# Patient Record
Sex: Female | Born: 1970
Health system: Southern US, Community
[De-identification: ages and names within clinical notes are randomized; demographics above are authoritative.]

## PROBLEM LIST (undated history)

## (undated) DIAGNOSIS — I10 Essential (primary) hypertension: Secondary | ICD-10-CM

## (undated) DIAGNOSIS — E785 Hyperlipidemia, unspecified: Secondary | ICD-10-CM

## (undated) DIAGNOSIS — Z87891 Personal history of nicotine dependence: Secondary | ICD-10-CM

## (undated) DIAGNOSIS — I2510 Atherosclerotic heart disease of native coronary artery without angina pectoris: Secondary | ICD-10-CM

## (undated) DIAGNOSIS — Z862 Personal history of diseases of the blood and blood-forming organs and certain disorders involving the immune mechanism: Secondary | ICD-10-CM

## (undated) DIAGNOSIS — I2119 ST elevation (STEMI) myocardial infarction involving other coronary artery of inferior wall: Secondary | ICD-10-CM

## (undated) DIAGNOSIS — E669 Obesity, unspecified: Secondary | ICD-10-CM

## (undated) HISTORY — DX: ST elevation (STEMI) myocardial infarction involving other coronary artery of inferior wall: I21.19

## (undated) HISTORY — DX: Personal history of nicotine dependence: Z87.891

## (undated) HISTORY — DX: Atherosclerotic heart disease of native coronary artery without angina pectoris: I25.10

## (undated) HISTORY — DX: Essential (primary) hypertension: I10

## (undated) HISTORY — DX: Personal history of diseases of the blood and blood-forming organs and certain disorders involving the immune mechanism: Z86.2

## (undated) HISTORY — DX: Hyperlipidemia, unspecified: E78.5

## (undated) HISTORY — DX: Obesity, unspecified: E66.9

---

## 2009-11-22 ENCOUNTER — Emergency Department (HOSPITAL_COMMUNITY): Admission: EM | Admit: 2009-11-22 | Discharge: 2009-11-23 | Payer: Self-pay | Admitting: Emergency Medicine

## 2010-02-02 DIAGNOSIS — I2119 ST elevation (STEMI) myocardial infarction involving other coronary artery of inferior wall: Secondary | ICD-10-CM

## 2010-02-02 HISTORY — PX: CORONARY STENT PLACEMENT: SHX1402

## 2010-02-02 HISTORY — DX: ST elevation (STEMI) myocardial infarction involving other coronary artery of inferior wall: I21.19

## 2010-02-20 ENCOUNTER — Inpatient Hospital Stay (HOSPITAL_COMMUNITY): Admission: EM | Admit: 2010-02-20 | Discharge: 2010-02-23 | Payer: Self-pay | Admitting: Emergency Medicine

## 2010-03-08 ENCOUNTER — Encounter (HOSPITAL_COMMUNITY): Admission: RE | Admit: 2010-03-08 | Discharge: 2010-06-04 | Payer: Self-pay | Admitting: Cardiology

## 2010-03-13 ENCOUNTER — Ambulatory Visit: Payer: Self-pay | Admitting: Cardiology

## 2010-04-23 ENCOUNTER — Ambulatory Visit: Payer: Self-pay | Admitting: Cardiology

## 2010-05-31 ENCOUNTER — Ambulatory Visit: Payer: Self-pay | Admitting: Cardiology

## 2010-10-20 LAB — COMPREHENSIVE METABOLIC PANEL
ALT: 22 U/L (ref 0–35)
AST: 47 U/L — ABNORMAL HIGH (ref 0–37)
Albumin: 3.4 g/dL — ABNORMAL LOW (ref 3.5–5.2)
Alkaline Phosphatase: 52 U/L (ref 39–117)
Creatinine, Ser: 1.4 mg/dL — ABNORMAL HIGH (ref 0.4–1.2)
GFR calc non Af Amer: 42 mL/min — ABNORMAL LOW (ref >60)
Sodium: 134 mEq/L — ABNORMAL LOW (ref 135–145)
Total Bilirubin: 0.3 mg/dL (ref 0.3–1.2)

## 2010-10-20 LAB — POCT I-STAT, CHEM 8
BUN: 17 mg/dL (ref 6–23)
Calcium, Ion: 1.08 mmol/L — ABNORMAL LOW (ref 1.12–1.32)
Chloride: 109 mEq/L (ref 96–112)
Glucose, Bld: 99 mg/dL (ref 70–99)
HCT: 36 % (ref 36.0–46.0)
Potassium: 4 mEq/L (ref 3.5–5.1)
Sodium: 137 mEq/L (ref 135–145)

## 2010-10-20 LAB — BASIC METABOLIC PANEL
Calcium: 8.6 mg/dL (ref 8.4–10.5)
Chloride: 107 mEq/L (ref 96–112)
GFR calc Af Amer: >60 mL/min (ref >60)
GFR calc non Af Amer: 50 mL/min — ABNORMAL LOW (ref >60)
Glucose, Bld: 101 mg/dL — ABNORMAL HIGH (ref 70–99)
Potassium: 4 mEq/L (ref 3.5–5.1)
Sodium: 138 mEq/L (ref 135–145)

## 2010-10-20 LAB — CARDIAC PANEL(CRET KIN+CKTOT+MB+TROPI)
Relative Index: 12.6 — ABNORMAL HIGH (ref 0.0–2.5)
Total CK: 764 U/L — ABNORMAL HIGH (ref 7–177)
Troponin I: 11.1 ng/mL (ref 0.00–0.06)
Troponin I: 27.69 ng/mL (ref 0.00–0.06)

## 2010-10-20 LAB — FOLATE: Folate: 5.7 ng/mL

## 2010-10-20 LAB — PROTIME-INR
INR: 1.21 (ref 0.00–1.49)
Prothrombin Time: 15.2 seconds (ref 11.6–15.2)

## 2010-10-20 LAB — FERRITIN: Ferritin: 12 ng/mL (ref 10–291)

## 2010-10-20 LAB — CBC
HCT: 30 % — ABNORMAL LOW (ref 36.0–46.0)
HCT: 33.7 % — ABNORMAL LOW (ref 36.0–46.0)
Hemoglobin: 9.9 g/dL — ABNORMAL LOW (ref 12.0–15.0)
MCH: 27.8 pg (ref 26.0–34.0)
MCH: 27.9 pg (ref 26.0–34.0)
MCH: 28 pg (ref 26.0–34.0)
MCHC: 33.5 g/dL (ref 30.0–36.0)
MCV: 83.2 fL (ref 78.0–100.0)
Platelets: 315 10*3/uL (ref 150–400)
Platelets: 328 10*3/uL (ref 150–400)
RBC: 3.53 MIL/uL — ABNORMAL LOW (ref 3.87–5.11)
RBC: 4.04 MIL/uL (ref 3.87–5.11)
RDW: 16.4 % — ABNORMAL HIGH (ref 11.5–15.5)
RDW: 16.4 % — ABNORMAL HIGH (ref 11.5–15.5)
RDW: 16.8 % — ABNORMAL HIGH (ref 11.5–15.5)
WBC: 9.5 10*3/uL (ref 4.0–10.5)

## 2010-10-20 LAB — DIFFERENTIAL
Eosinophils Relative: 1 % (ref 0–5)
Lymphocytes Relative: 17 % (ref 12–46)
Lymphs Abs: 1.9 10*3/uL (ref 0.7–4.0)
Monocytes Relative: 5 % (ref 3–12)
Neutrophils Relative %: 76 % (ref 43–77)

## 2010-10-20 LAB — IRON AND TIBC
Iron: 33 ug/dL — ABNORMAL LOW (ref 42–135)
Saturation Ratios: 9 % — ABNORMAL LOW (ref 20–55)

## 2010-10-20 LAB — VITAMIN B12: Vitamin B-12: 407 pg/mL (ref 211–911)

## 2010-10-20 LAB — RETICULOCYTES: Retic Ct Pct: 1.5 % (ref 0.4–3.1)

## 2010-10-23 LAB — CBC
HCT: 29 % — ABNORMAL LOW (ref 36.0–46.0)
MCV: 83.3 fL (ref 78.0–100.0)
Platelets: 349 10*3/uL (ref 150–400)
RBC: 3.49 MIL/uL — ABNORMAL LOW (ref 3.87–5.11)
WBC: 12.8 10*3/uL — ABNORMAL HIGH (ref 4.0–10.5)

## 2010-10-23 LAB — POCT I-STAT, CHEM 8
Calcium, Ion: 1.05 mmol/L — ABNORMAL LOW (ref 1.12–1.32)
Hemoglobin: 10.9 g/dL — ABNORMAL LOW (ref 12.0–15.0)
Potassium: 3.7 mEq/L (ref 3.5–5.1)

## 2010-10-23 LAB — DIFFERENTIAL
Eosinophils Relative: 1 % (ref 0–5)
Lymphocytes Relative: 17 % (ref 12–46)
Lymphs Abs: 2.2 10*3/uL (ref 0.7–4.0)
Monocytes Relative: 6 % (ref 3–12)

## 2010-10-23 LAB — D-DIMER, QUANTITATIVE: D-Dimer, Quant: 0.37 ug/mL-FEU (ref 0.00–0.48)

## 2010-10-23 LAB — POCT CARDIAC MARKERS: Troponin i, poc: <0.05 ng/mL (ref 0.00–0.09)

## 2010-10-24 ENCOUNTER — Encounter: Payer: Self-pay | Admitting: Cardiology

## 2010-10-24 DIAGNOSIS — I10 Essential (primary) hypertension: Secondary | ICD-10-CM | POA: Insufficient documentation

## 2010-10-24 DIAGNOSIS — E785 Hyperlipidemia, unspecified: Secondary | ICD-10-CM | POA: Insufficient documentation

## 2010-10-24 DIAGNOSIS — E669 Obesity, unspecified: Secondary | ICD-10-CM | POA: Insufficient documentation

## 2010-10-24 DIAGNOSIS — I2119 ST elevation (STEMI) myocardial infarction involving other coronary artery of inferior wall: Secondary | ICD-10-CM | POA: Insufficient documentation

## 2010-10-24 DIAGNOSIS — Z862 Personal history of diseases of the blood and blood-forming organs and certain disorders involving the immune mechanism: Secondary | ICD-10-CM | POA: Insufficient documentation

## 2010-10-24 DIAGNOSIS — I2510 Atherosclerotic heart disease of native coronary artery without angina pectoris: Secondary | ICD-10-CM | POA: Insufficient documentation

## 2010-10-30 ENCOUNTER — Ambulatory Visit: Payer: Self-pay | Admitting: Nurse Practitioner

## 2010-11-13 ENCOUNTER — Telehealth: Payer: Self-pay | Admitting: Cardiology

## 2010-11-13 ENCOUNTER — Emergency Department (HOSPITAL_COMMUNITY): Payer: BLUE CROSS/BLUE SHIELD

## 2010-11-13 ENCOUNTER — Emergency Department (HOSPITAL_COMMUNITY)
Admission: EM | Admit: 2010-11-13 | Discharge: 2010-11-13 | Disposition: A | Discharge status: Home / Self Care | Payer: BLUE CROSS/BLUE SHIELD | Attending: Emergency Medicine | Admitting: Emergency Medicine

## 2010-11-13 DIAGNOSIS — I252 Old myocardial infarction: Secondary | ICD-10-CM | POA: Insufficient documentation

## 2010-11-13 DIAGNOSIS — Z9889 Other specified postprocedural states: Secondary | ICD-10-CM | POA: Insufficient documentation

## 2010-11-13 DIAGNOSIS — I10 Essential (primary) hypertension: Secondary | ICD-10-CM | POA: Insufficient documentation

## 2010-11-13 DIAGNOSIS — Z79899 Other long term (current) drug therapy: Secondary | ICD-10-CM | POA: Insufficient documentation

## 2010-11-13 DIAGNOSIS — R072 Precordial pain: Secondary | ICD-10-CM | POA: Insufficient documentation

## 2010-11-13 LAB — DIFFERENTIAL
Eosinophils Relative: 3 % (ref 0–5)
Lymphocytes Relative: 17 % (ref 12–46)
Monocytes Absolute: 0.5 10*3/uL (ref 0.1–1.0)
Monocytes Relative: 5 % (ref 3–12)
Neutro Abs: 7.4 10*3/uL (ref 1.7–7.7)

## 2010-11-13 LAB — CBC
HCT: 29.9 % — ABNORMAL LOW (ref 36.0–46.0)
Hemoglobin: 9.2 g/dL — ABNORMAL LOW (ref 12.0–15.0)
MCH: 22.8 pg — ABNORMAL LOW (ref 26.0–34.0)
MCHC: 30.8 g/dL (ref 30.0–36.0)
RDW: 14.6 % (ref 11.5–15.5)

## 2010-11-13 LAB — POCT I-STAT, CHEM 8
BUN: 9 mg/dL (ref 6–23)
Creatinine, Ser: 1.2 mg/dL (ref 0.4–1.2)
Glucose, Bld: 98 mg/dL (ref 70–99)
Potassium: 4 mEq/L (ref 3.5–5.1)
Sodium: 140 mEq/L (ref 135–145)
TCO2: 25 mmol/L (ref 0–100)

## 2010-11-13 NOTE — Telephone Encounter (Signed)
IS HAVING ISSUES HEAVINESS IN CHEST WANTS TO SEE IF SHE CAN BE SEEN ASAP

## 2010-11-13 NOTE — Telephone Encounter (Signed)
Spoke with pt's husband, pt has been c/o chest heaviness for the last several days.  The chest pressure is getting worse.  Norma Fredrickson NP notified and instructed RN to have pt go to ER ASAP.  Pt's husband notified of Loris's instructions and verbalized to RN understanding of instructions.

## 2011-05-13 ENCOUNTER — Other Ambulatory Visit: Payer: Self-pay | Admitting: Cardiology

## 2011-05-23 ENCOUNTER — Encounter: Payer: Self-pay | Admitting: Nurse Practitioner

## 2011-05-23 ENCOUNTER — Ambulatory Visit (INDEPENDENT_AMBULATORY_CARE_PROVIDER_SITE_OTHER): Payer: BC Managed Care – PPO | Admitting: Nurse Practitioner

## 2011-05-23 VITALS — BP 128/90 | HR 80 | Ht 62.0 in | Wt 223.4 lb

## 2011-05-23 DIAGNOSIS — E785 Hyperlipidemia, unspecified: Secondary | ICD-10-CM

## 2011-05-23 DIAGNOSIS — D649 Anemia, unspecified: Secondary | ICD-10-CM | POA: Insufficient documentation

## 2011-05-23 DIAGNOSIS — I2510 Atherosclerotic heart disease of native coronary artery without angina pectoris: Secondary | ICD-10-CM

## 2011-05-23 LAB — CBC WITH DIFFERENTIAL/PLATELET
Basophils Absolute: 0 10*3/uL (ref 0.0–0.1)
Basophils Relative: 0.4 % (ref 0.0–3.0)
Eosinophils Absolute: 0.2 10*3/uL (ref 0.0–0.7)
Eosinophils Relative: 2.5 % (ref 0.0–5.0)
HCT: 28.2 % — ABNORMAL LOW (ref 36.0–46.0)
Hemoglobin: 8.8 g/dL — ABNORMAL LOW (ref 12.0–15.0)
Lymphocytes Relative: 32.3 % (ref 12.0–46.0)
Lymphs Abs: 2.2 10*3/uL (ref 0.7–4.0)
MCHC: 31.1 g/dL (ref 30.0–36.0)
MCV: 69.2 fl — ABNORMAL LOW (ref 78.0–100.0)
Monocytes Absolute: 0.3 10*3/uL (ref 0.1–1.0)
Monocytes Relative: 4.8 % (ref 3.0–12.0)
Neutro Abs: 4.1 10*3/uL (ref 1.4–7.7)
Neutrophils Relative %: 60 % (ref 43.0–77.0)
Platelets: 388 10*3/uL (ref 150.0–400.0)
RBC: 4.07 Mil/uL (ref 3.87–5.11)
RDW: 16.7 % — ABNORMAL HIGH (ref 11.5–14.6)
WBC: 6.8 10*3/uL (ref 4.5–10.5)

## 2011-05-23 LAB — BASIC METABOLIC PANEL
BUN: 12 mg/dL (ref 6–23)
CO2: 26 mEq/L (ref 19–32)
Calcium: 8.7 mg/dL (ref 8.4–10.5)
Chloride: 107 mEq/L (ref 96–112)
Creatinine, Ser: 1 mg/dL (ref 0.4–1.2)
GFR: 64.57 mL/min (ref >60.00)
Glucose, Bld: 106 mg/dL — ABNORMAL HIGH (ref 70–99)
Potassium: 4 mEq/L (ref 3.5–5.1)
Sodium: 139 mEq/L (ref 135–145)

## 2011-05-23 LAB — LIPID PANEL
Cholesterol: 117 mg/dL (ref 0–200)
HDL: 35.7 mg/dL — ABNORMAL LOW (ref >39.00)
LDL Cholesterol: 48 mg/dL (ref 0–99)
Total CHOL/HDL Ratio: 3
Triglycerides: 165 mg/dL — ABNORMAL HIGH (ref 0.0–149.0)
VLDL: 33 mg/dL (ref 0.0–40.0)

## 2011-05-23 LAB — HEPATIC FUNCTION PANEL
ALT: 18 U/L (ref 0–35)
AST: 19 U/L (ref 0–37)
Albumin: 3.8 g/dL (ref 3.5–5.2)
Alkaline Phosphatase: 57 U/L (ref 39–117)
Bilirubin, Direct: 0 mg/dL (ref 0.0–0.3)
Total Bilirubin: 0.4 mg/dL (ref 0.3–1.2)
Total Protein: 7.5 g/dL (ref 6.0–8.3)

## 2011-05-23 MED ORDER — ROSUVASTATIN CALCIUM 10 MG PO TABS: 10.0000 mg | ORAL_TABLET | Freq: Every day | ORAL | Status: DC

## 2011-05-23 MED ORDER — NITROGLYCERIN 0.4 MG SL SUBL: 0.4000 mg | SUBLINGUAL_TABLET | SUBLINGUAL | Status: DC | PRN

## 2011-05-23 MED ORDER — CLOPIDOGREL BISULFATE 75 MG PO TABS: 75.0000 mg | ORAL_TABLET | Freq: Every day | ORAL | Status: DC

## 2011-05-23 MED ORDER — METOPROLOL TARTRATE 25 MG PO TABS: 25.0000 mg | ORAL_TABLET | Freq: Two times a day (BID) | ORAL | Status: DC

## 2011-05-23 MED ORDER — NIACIN ER (ANTIHYPERLIPIDEMIC) 500 MG PO TBCR: 500.0000 mg | EXTENDED_RELEASE_TABLET | Freq: Every day | ORAL | Status: DC

## 2011-05-23 NOTE — Assessment & Plan Note (Addendum)
She has not had formal evaluation for this persistent problems. We will check CBC today. Her periods are very heavy and irregular.This was a problem she was having even before her MI. She does wish to stay on her Plavix.  I have asked her to follow up with her GYN. I am sure this contributes to her fatigue. I stressed to her the relationship between a higher hemoglobin and feeling better. This may hopefully help her start exercising.

## 2011-05-23 NOTE — Progress Notes (Signed)
    Annette Singleton Date of Birth: 1971/07/19 Medical Record #[id number]  History of Present Illness: Annette Singleton is seen today for a follow up visit. She is a former patient of Dr. Ronnald Nian. She is seen for Dr. Excell Seltzer. She has not been in the office in over a year. She says she is doing ok. Needs medicines refilled. No chest pain. She has gained weight but is not smoking. She says she is always tired. She continues to have heavy periods. She did not get an anemia work up as suggested one year ago. She wants to stay on her Plavix indefinitely. Blood pressure at home has been in the 120/70 range.   Current Outpatient Prescriptions on File Prior to Visit  Medication Sig Dispense Refill  . aspirin 325 MG tablet Take 325 mg by mouth daily.       Marland Kitchen DISCONTD: clopidogrel (PLAVIX) 75 MG tablet TAKE 1 TABLET BY MOUTH DAILY  15 tablet  0  . DISCONTD: CRESTOR 10 MG tablet TAKE 1 TABLET BY MOUTH DAILY  15 tablet  0  . DISCONTD: metoprolol tartrate (LOPRESSOR) 25 MG tablet TAKE 1 TABLET BY MOUTH TWICE DAILY  30 tablet  0  . DISCONTD: NIASPAN 500 MG CR tablet TAKE 1 TABLET BY MOUTH AT BEDTIME  15 tablet  0    Allergies  Allergen Reactions  . Zithromax (Azithromycin)     Past Medical History  Diagnosis Date  . CAD (coronary artery disease)   . Obese   . HTN (hypertension)   . Hyperlipemia   . History of anemia   . Inferior MI July 2011    with BMS to distal RCA; EF is normal.  . Past use of tobacco     Past Surgical History  Procedure Date  . Coronary stent placement July 2011    distal RCA; EF is normal.     History  Smoking status  . Former Smoker  . Types: Cigarettes  . Quit date: 02/20/2010  Smokeless tobacco  . Not on file    History  Alcohol Use No    History reviewed. No pertinent family history.  Review of Systems: The review of systems is positive for fatigue. No chest pain. No dyspnea.  All other systems were reviewed and are negative.  Physical Exam: BP 128/90   Pulse 80  Ht 5\' 2"  (1.575 m)  Wt 223 lb 6.4 oz (101.334 kg)  BMI 40.86 kg/m2 Patient is very pleasant and in no acute distress. She is obese. Skin is warm and dry. Color is normal.  HEENT is unremarkable. Normocephalic/atraumatic. PERRL. Sclera are nonicteric. Neck is supple. No masses. No JVD. Lungs are clear. Cardiac exam shows a regular rate and rhythm. Abdomen is soft. Extremities are without edema. Gait and ROM are intact. No gross neurologic deficits noted.   LABORATORY DATA: PENDING   Assessment / Plan:

## 2011-05-23 NOTE — Patient Instructions (Signed)
I have refilled all your medicines.  We will check your lab work today.  I encourage you to get back to see your GYN  We will see you back in 6 months.  Exercise and weight loss is strongly encouraged.

## 2011-05-23 NOTE — Assessment & Plan Note (Signed)
We will check labs today. 

## 2011-05-23 NOTE — Assessment & Plan Note (Signed)
She has had prior inferior MI over a year ago treated with non drug eluting stent to the distal RCA. She continues to do well with no symptoms. She had been given the ok to come off of her Plavix several months ago, but she would like to continue. Weight loss and regular exercise is encouraged. I will see her back in one year. Her medicines are refilled today. Patient is agreeable to this plan and will call if any problems develop in the interim.

## 2011-07-01 IMAGING — CR DG CHEST 2V
2 series · 2 of 2 positions shown · non-contrast
Comparison: None.

CLINICAL DATA: Syncope.  Cough.

CHEST - 2 VIEW

[w chest pa]
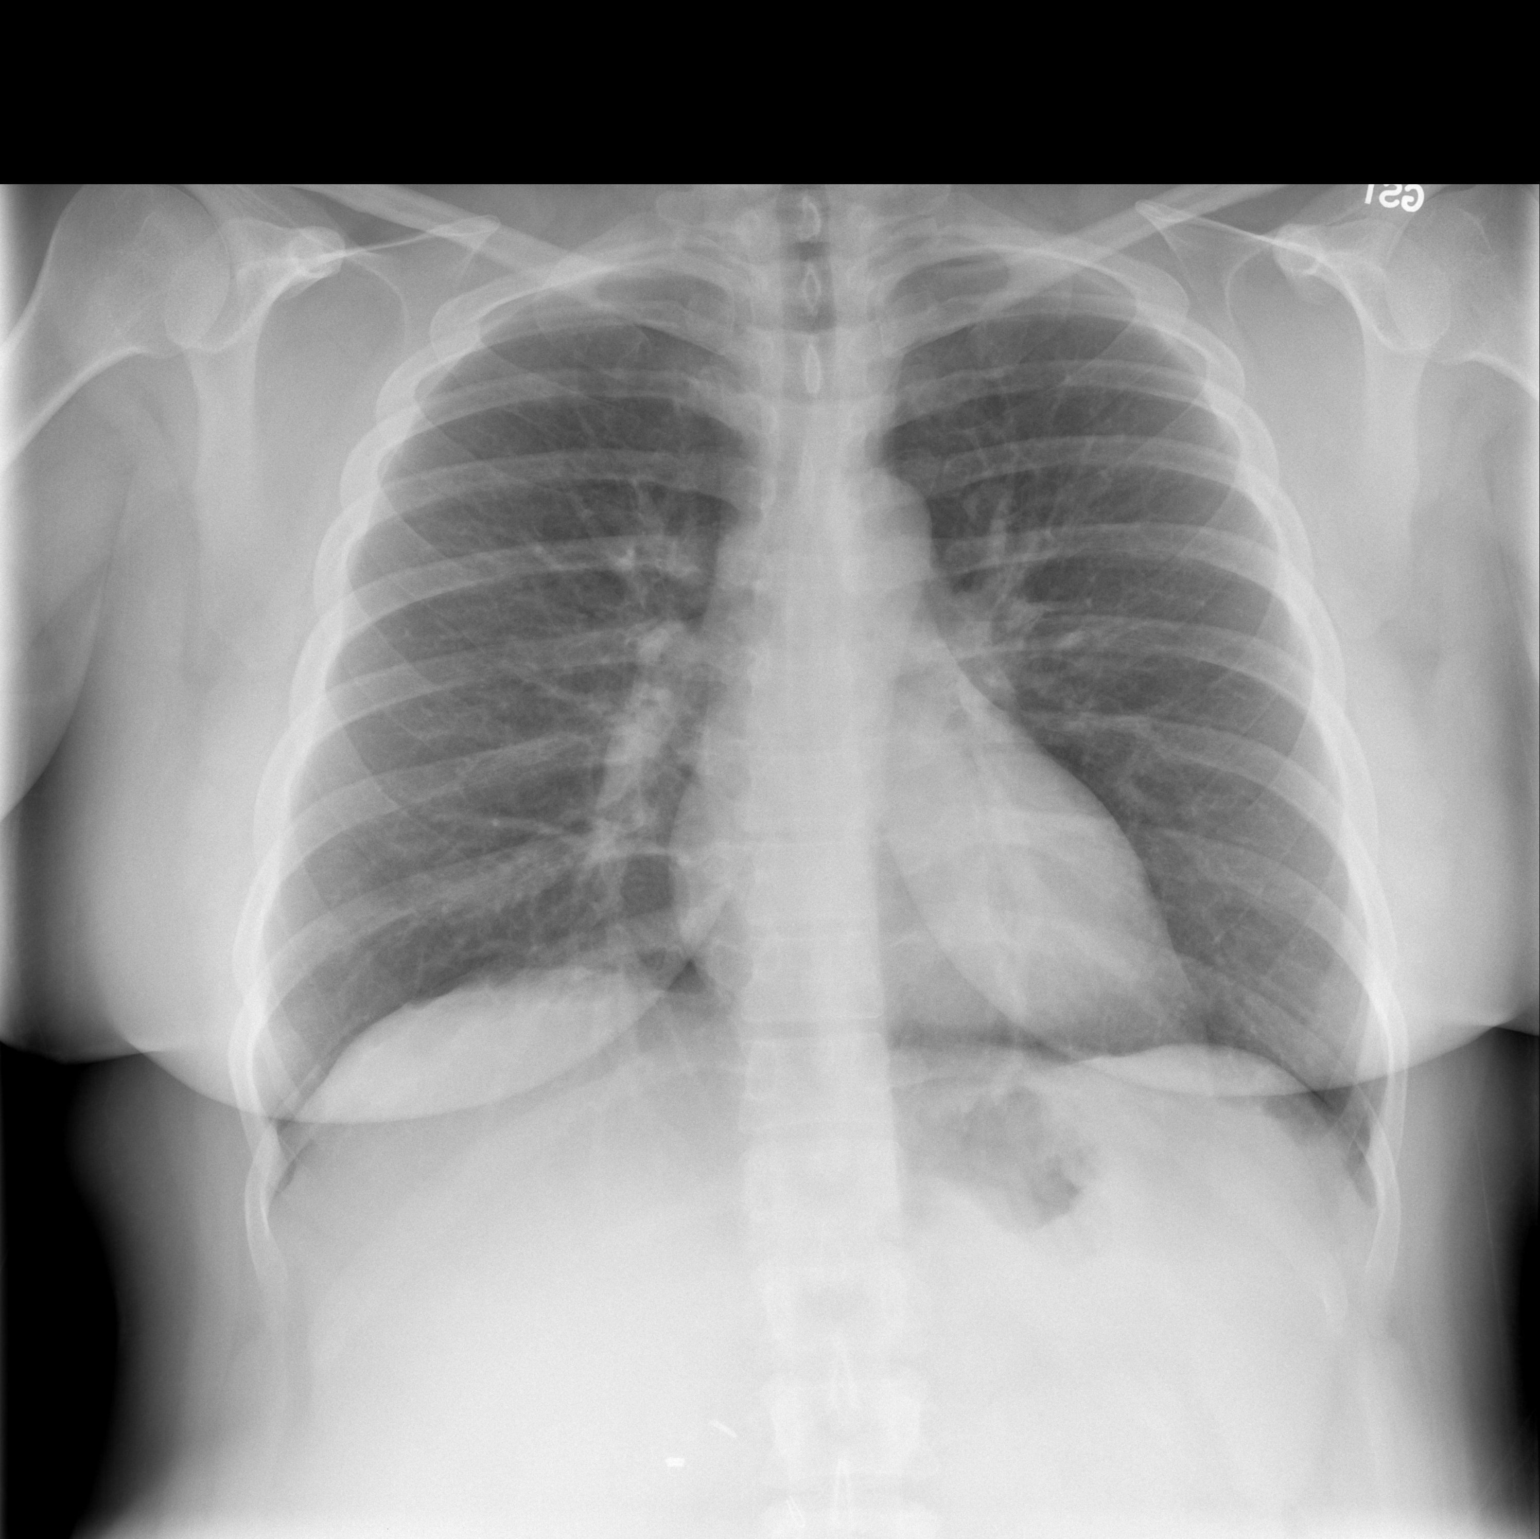

[w chest lat]
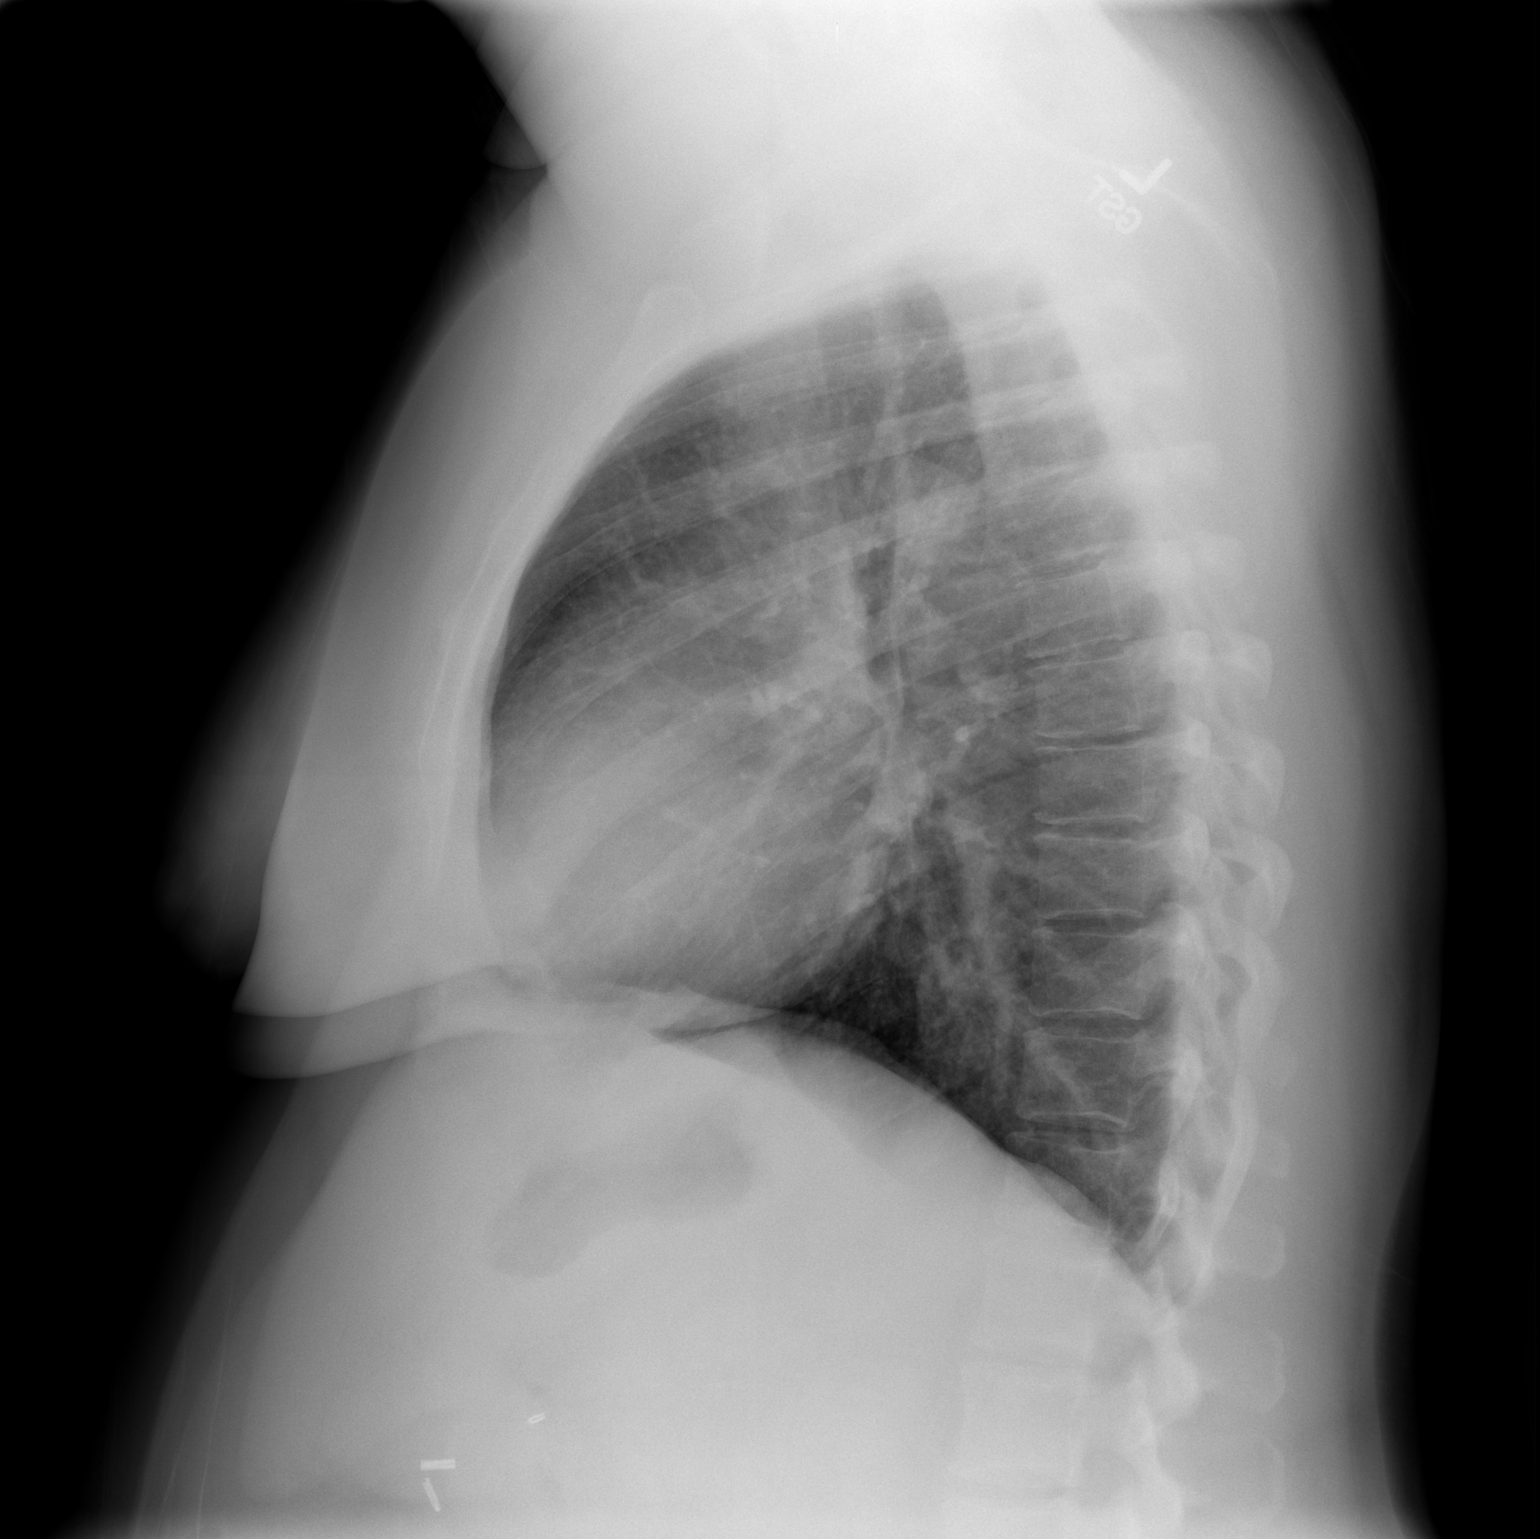

[2 of 2 positions shown; findings below may reference images not displayed]

FINDINGS: Cardiac and mediastinal contours appear normal.

The lungs appear clear.

No pleural effusion is identified.
IMPRESSION: No significant abnormality identified.

## 2011-09-29 IMAGING — CR DG CHEST 1V PORT
1 series · 1 of 1 positions shown · non-contrast
Comparison: 11/22/2009.

CLINICAL DATA: 38-year-old female with chest pain.

PORTABLE CHEST - 1 VIEW

[AP]
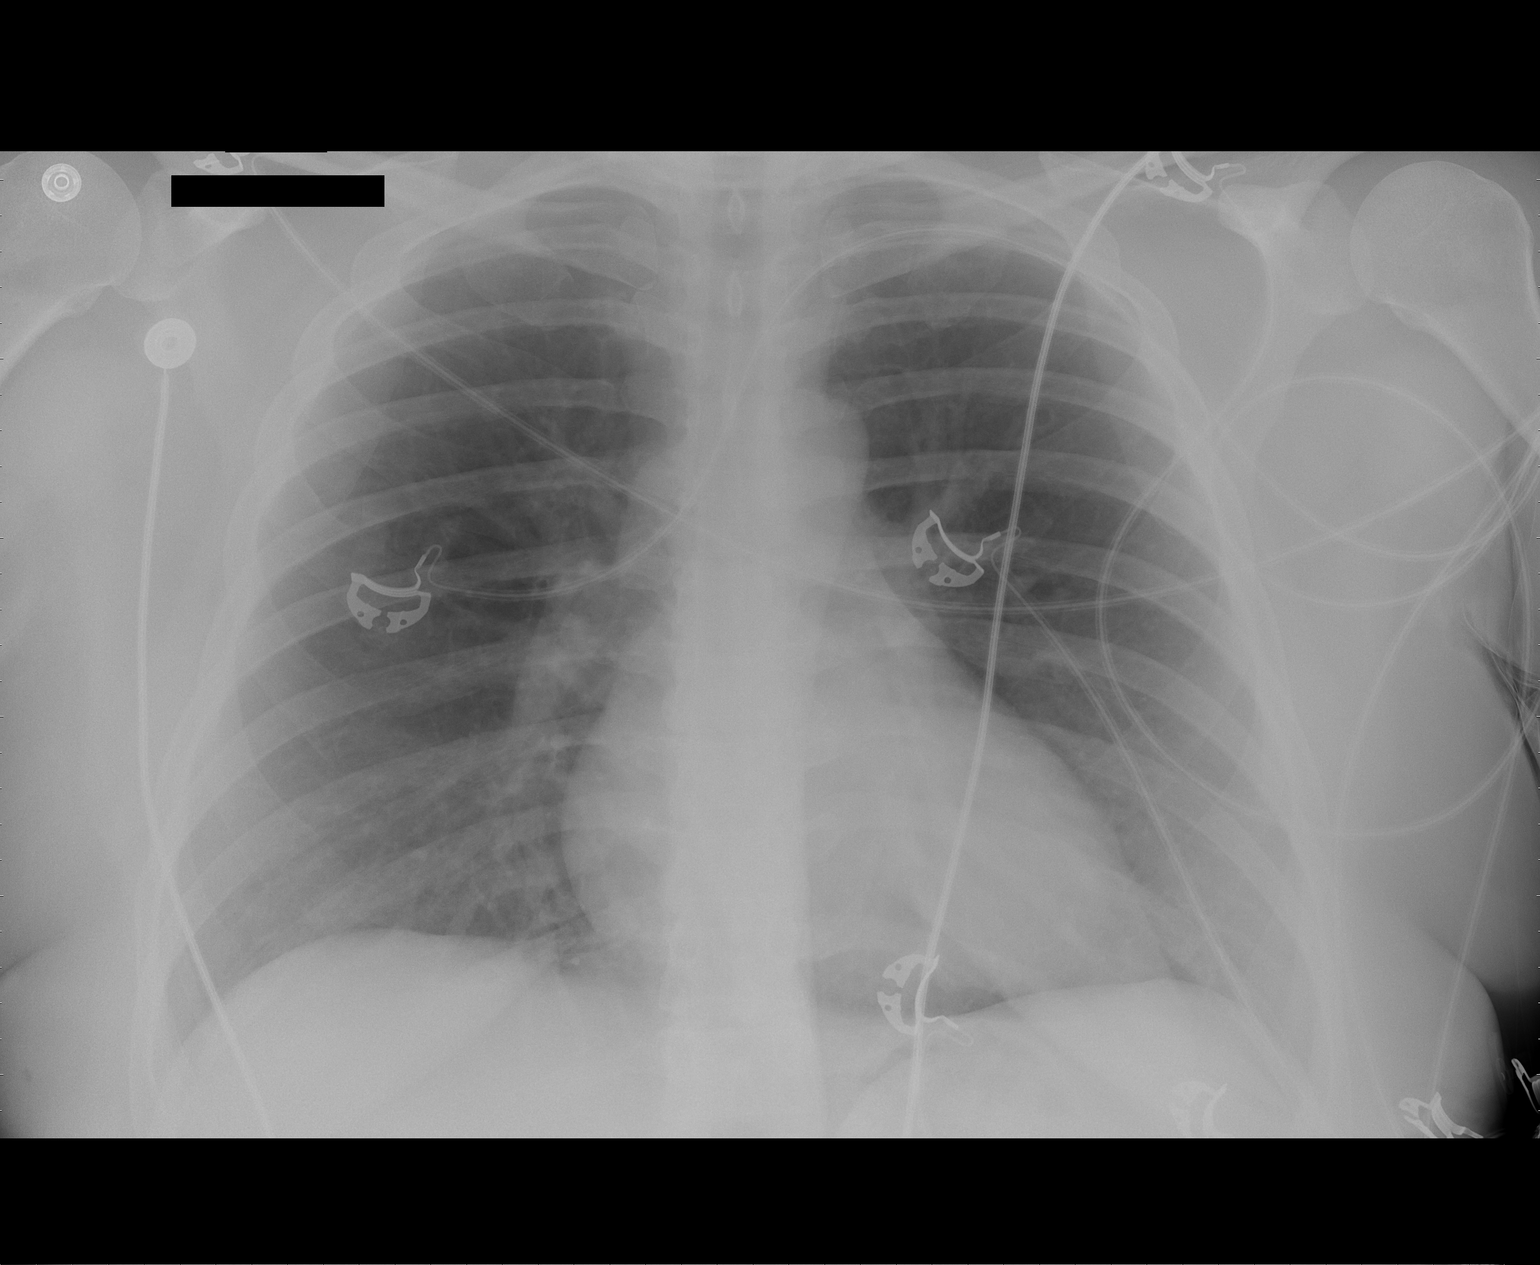

[1 of 1 positions shown; findings below may reference images not displayed]

FINDINGS: AP portable semi upright view AP portable semi upright
view 1310 hours.  Normal lung volumes. Normal cardiac size and
mediastinal contours.  Visualized tracheal air column is within
normal limits.  No pneumothorax.  Allowing for portable technique,
the lungs are clear.
IMPRESSION: Negative, no acute cardiopulmonary abnormality.

## 2011-11-28 ENCOUNTER — Telehealth: Payer: Self-pay | Admitting: Nurse Practitioner

## 2011-11-28 DIAGNOSIS — E785 Hyperlipidemia, unspecified: Secondary | ICD-10-CM

## 2011-11-28 NOTE — Telephone Encounter (Signed)
Please return call to patient [phone]  Patient seen by Annette Singleton., needs authorization for crestor prescription.  Please return call to patient to advise, she can be reached at [phone].

## 2011-11-28 NOTE — Telephone Encounter (Signed)
Patient states Crestor 10 mg needs prior authorization to refill this prescription per  [facility]. Pharmacy was called at [phone]  to fax a prior authorization form. Form will be fax to [phone].

## 2011-11-29 MED ORDER — ATORVASTATIN CALCIUM 10 MG PO TABS: 10.0000 mg | ORAL_TABLET | Freq: Every day | ORAL | Status: DC

## 2011-11-29 NOTE — Telephone Encounter (Signed)
Atorvastatin called in, lab redraw set.

## 2011-11-29 NOTE — Telephone Encounter (Signed)
I called pt's insurance agent [employer] [phone]/ spoke with [employer]. Pt has not tried any generics and they are denying authorization for  crestor 10 mg, please advise which generic pt can try. Then resend to pharmacy. Pt was informed. Dr Excell Seltzer pt/ Norma Fredrickson NP last saw.

## 2011-11-29 NOTE — Telephone Encounter (Signed)
Pt calling back to see if auth was obtained yet, pls call [phone]

## 2011-11-29 NOTE — Telephone Encounter (Signed)
Lipids from October reviewed and cholesterol and LDL were very low. She could try lipitor 10 mg daily and would followup with a lipid panel and LFT's in 3 months.

## 2011-11-30 NOTE — Telephone Encounter (Signed)
Alvino Chapel, Did I need to do anything with this? Looks like she is going to try lipitor.  Thanks Lawson Fiscal

## 2011-12-02 NOTE — Telephone Encounter (Signed)
No further attention needed. Completed.

## 2012-03-02 ENCOUNTER — Telehealth: Payer: Self-pay | Admitting: *Deleted

## 2012-03-02 NOTE — Telephone Encounter (Signed)
Pharmacy states Niaspan is on back order is there a replacement that this patient can have

## 2012-03-02 NOTE — Telephone Encounter (Signed)
Can she try a different pharmacy ?

## 2012-03-04 ENCOUNTER — Other Ambulatory Visit: Payer: BC Managed Care – PPO

## 2012-03-04 NOTE — Telephone Encounter (Signed)
Called pt several times for the last 2 days  to see if there was a different pharmacy she wanted to use, no answer and voice mail is not set up so I couldn't leave a message. Will try again.

## 2012-03-05 NOTE — Telephone Encounter (Signed)
Tried calling the pt again with no answer and no VM. Also tried spouse and the number is disconnected.

## 2012-03-06 NOTE — Telephone Encounter (Signed)
Spoke to pharmacy told them when pt comes in for a refill to give [place] a call involving this issue .

## 2012-03-31 ENCOUNTER — Telehealth: Payer: Self-pay | Admitting: Cardiology

## 2012-03-31 NOTE — Telephone Encounter (Signed)
Can she get from another pharmacy? Not much to offer in its place. Could just wait til it is off of backorder. I have not heard that it is discontinued. Did she ever get her anemia worked up?

## 2012-03-31 NOTE — Telephone Encounter (Signed)
Niaspan is on back order and will be discontinued and they need an alternate drug

## 2012-03-31 NOTE — Telephone Encounter (Signed)
wrong home number for pt, called pharmacy, they do not have phone number for her/ pt declines calls from pharmacy. Pt needs yearly and labs, will forward to Norma Fredrickson Np for advise for replacement for Niaspan. Pharmacy will give note to pt when she comes in.

## 2012-04-01 NOTE — Telephone Encounter (Signed)
I have called all numbers listed/ spouse number also, none are working. I Did not call work number. Left msg with pharmacy for pt to call us.

## 2012-05-25 ENCOUNTER — Other Ambulatory Visit: Payer: Self-pay | Admitting: Nurse Practitioner

## 2012-05-26 ENCOUNTER — Other Ambulatory Visit: Payer: Self-pay | Admitting: Cardiovascular Disease

## 2012-06-21 IMAGING — CR DG CHEST 1V PORT
1 series · 1 of 1 positions shown · non-contrast
Comparison: 02/20/2010

CLINICAL DATA: Chest pain, hypertension.

PORTABLE CHEST - 1 VIEW

[AP]
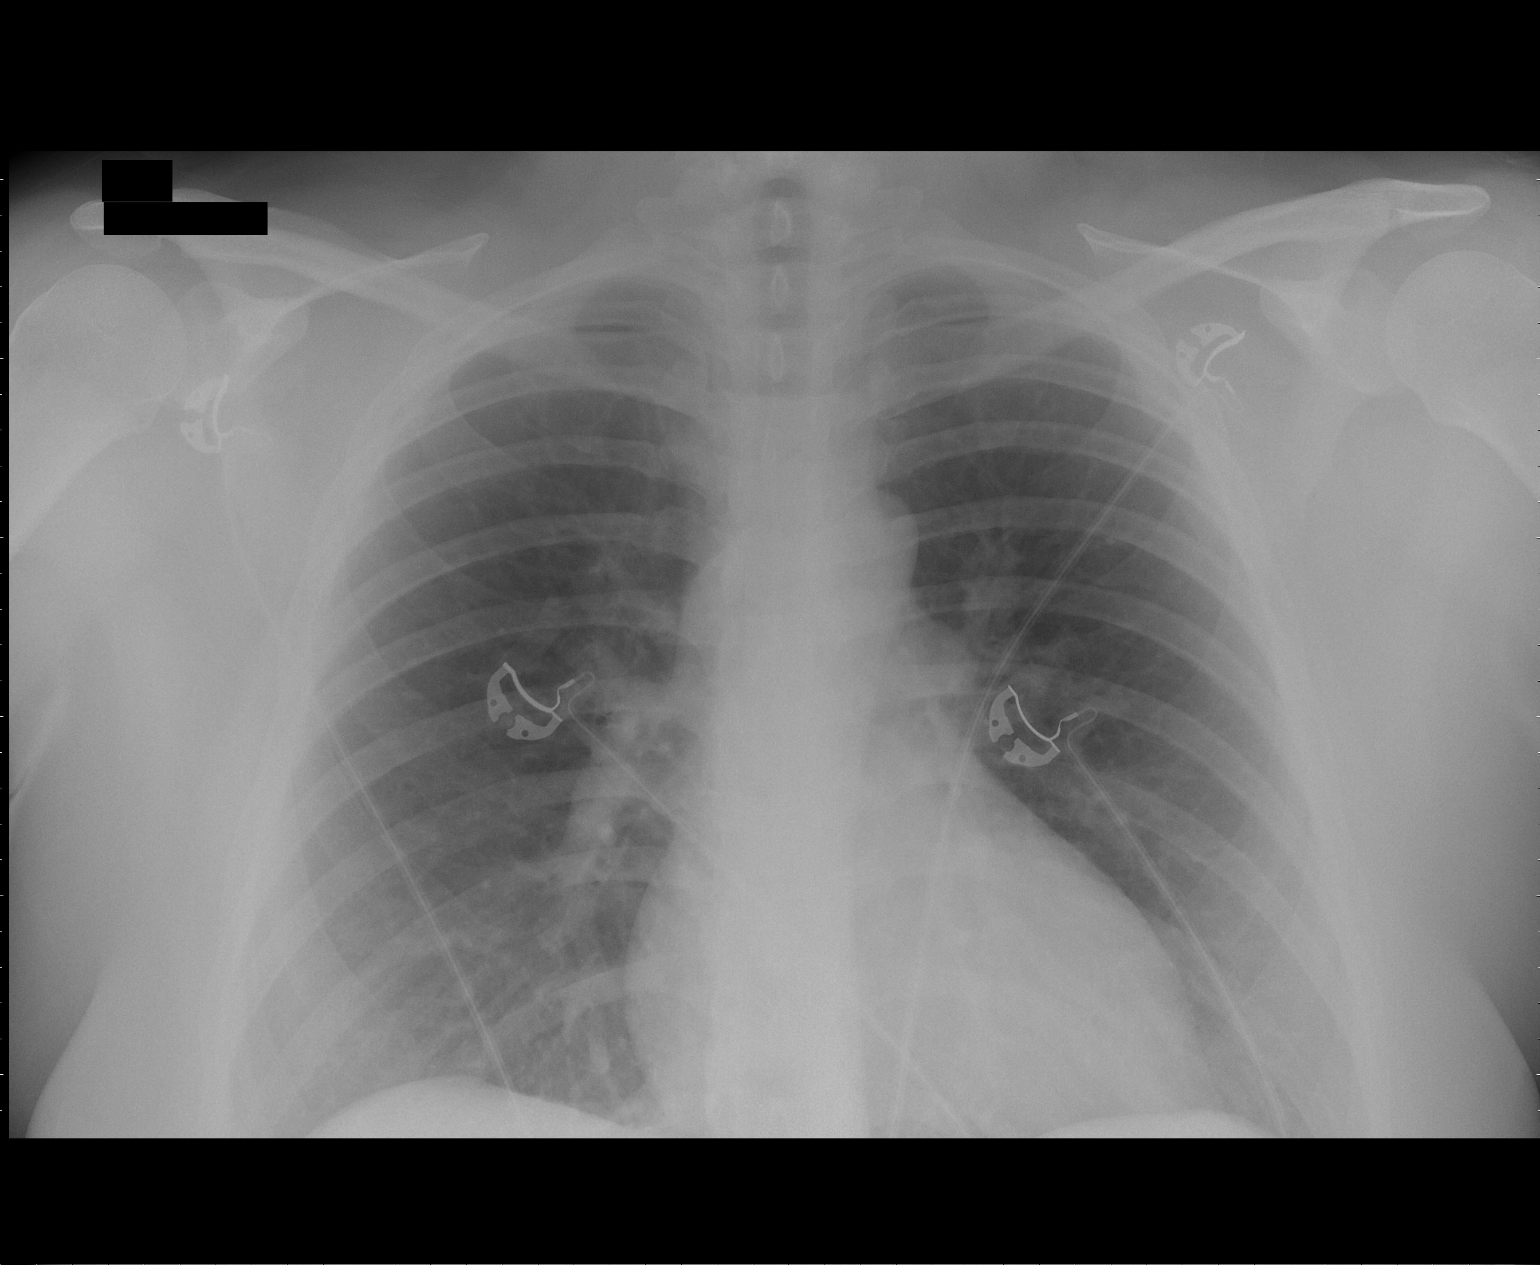

[1 of 1 positions shown; findings below may reference images not displayed]

FINDINGS: Heart and mediastinal contours are within normal limits.
No focal opacities or effusions.  No acute bony abnormality.
IMPRESSION: No active disease.

## 2012-06-26 ENCOUNTER — Other Ambulatory Visit: Payer: Self-pay | Admitting: Nurse Practitioner

## 2012-07-13 ENCOUNTER — Other Ambulatory Visit: Payer: Self-pay | Admitting: *Deleted

## 2012-07-13 DIAGNOSIS — E7800 Pure hypercholesterolemia, unspecified: Secondary | ICD-10-CM

## 2012-07-13 MED ORDER — ATORVASTATIN CALCIUM 10 MG PO TABS: 10.0000 mg | ORAL_TABLET | Freq: Every day | ORAL | Status: DC

## 2012-07-13 NOTE — Telephone Encounter (Signed)
Husband requested refill, scheduled appointment and sent to pharmacy

## 2012-07-20 ENCOUNTER — Ambulatory Visit: Payer: BC Managed Care – PPO | Admitting: Nurse Practitioner

## 2012-07-22 ENCOUNTER — Telehealth: Payer: Self-pay | Admitting: Physician Assistant

## 2012-07-22 ENCOUNTER — Other Ambulatory Visit: Payer: Self-pay | Admitting: Cardiovascular Disease

## 2012-07-22 NOTE — Telephone Encounter (Addendum)
Patient's husband called in after-hours for a medication refill of her metoprolol (phone number [phone]). The patient has no current complaints except elevated BP at 167/96. No CP, SOB, visual changes, headache or any other symptoms. She has not been in to see our office since 05/2011. She has been out of metoprolol for 2 days. She was given a prescription in November 2013 with instructions that she needs a follow-up appointment before refills will be given. Her husband sounded agitated when I gave him this information. He said he didn't understand why she needed to come in and said he planned to call her regular doctor tomorrow and have all of her prescriptions transferred there. I told him that was certainly a reasonable idea, so that she had continuity of care from a physician that was regularly seeing her. However I also told him due to her heart history she should also follow up with her cardiologist regularly. He agreed. I told him we do not typically call in prescriptions after-hours, but offered to call in a short term rx to cover them until they could call her doctor tomorrow. He accepted this offer. I called in metoprolol tartrate 25mg  bid disp #2 [facility]. I also told them if she develops symptoms related to her blood pressure she is welcome to proceed to ER or urgent care for further evaluation if she feels that she needs to be seen sooner.   Her husband later called back the number I had called him on and asked if I could get her an appointment tomorrow. I told him I don't have access to schedule after-hours and the best way to request an appointment is to call first thing tomorrow morning when the office opens. He verbalized understanding and gratitude and plans to do so. Yazleemar Strassner PA-C

## 2012-07-23 ENCOUNTER — Telehealth: Payer: Self-pay | Admitting: Nurse Practitioner

## 2012-07-23 ENCOUNTER — Other Ambulatory Visit: Payer: Self-pay | Admitting: *Deleted

## 2012-07-23 MED ORDER — METOPROLOL TARTRATE 25 MG PO TABS: 25.0000 mg | ORAL_TABLET | Freq: Two times a day (BID) | ORAL | Status: DC

## 2012-07-23 NOTE — Telephone Encounter (Signed)
Provided pt an appointment with Norma Fredrickson at her next available appt on 08/04/2012.  Refilled Metoprolol 25mg  BID #30 with 0 refills.

## 2012-07-23 NOTE — Telephone Encounter (Signed)
New problem:   Read telephone message from [place].    Husband Call the on-call service last night spoke with [facility].  Patient ran out of her medication they won't renew until she makes an appt. Husband is asking for an appt for today.

## 2012-08-04 ENCOUNTER — Ambulatory Visit: Payer: BC Managed Care – PPO | Admitting: Nurse Practitioner

## 2012-08-04 ENCOUNTER — Ambulatory Visit (INDEPENDENT_AMBULATORY_CARE_PROVIDER_SITE_OTHER): Payer: BC Managed Care – PPO | Admitting: Nurse Practitioner

## 2012-08-04 ENCOUNTER — Encounter: Payer: Self-pay | Admitting: Nurse Practitioner

## 2012-08-04 VITALS — BP 154/90 | HR 64 | Ht 62.0 in | Wt 237.1 lb

## 2012-08-04 DIAGNOSIS — I259 Chronic ischemic heart disease, unspecified: Secondary | ICD-10-CM

## 2012-08-04 DIAGNOSIS — E7800 Pure hypercholesterolemia, unspecified: Secondary | ICD-10-CM

## 2012-08-04 MED ORDER — METOPROLOL TARTRATE 25 MG PO TABS: 25.0000 mg | ORAL_TABLET | Freq: Two times a day (BID) | ORAL | Status: DC

## 2012-08-04 MED ORDER — NIACIN ER (ANTIHYPERLIPIDEMIC) 500 MG PO TBCR: 500.0000 mg | EXTENDED_RELEASE_TABLET | Freq: Every day | ORAL | Status: DC

## 2012-08-04 MED ORDER — ATORVASTATIN CALCIUM 10 MG PO TABS: 10.0000 mg | ORAL_TABLET | Freq: Every day | ORAL | Status: DC

## 2012-08-04 MED ORDER — CLOPIDOGREL BISULFATE 75 MG PO TABS: 75.0000 mg | ORAL_TABLET | Freq: Every day | ORAL | Status: DC

## 2012-08-04 NOTE — Patient Instructions (Addendum)
I would like for you to monitor your blood pressure at home and keep a diary - call Danielle in about 2 weeks and let her know what your blood pressure is running. Goal is to be less than 135/85  I would recommend getting back in to see the GYN  Ok to have your teeth pulled - you will need to stop your Plavix prior to any tooth extractions  I will see you in a year  Get active!!  I have refilled your medicines today.   Call the [facility] office at [phone] if you have any questions, problems or concerns.

## 2012-08-04 NOTE — Progress Notes (Signed)
[name] Date of Birth: [birth year] Medical Record #[id number]  History of Present Illness: Ms. Annette Singleton is seen back today for a 14 month follow up visit. She is seen for Dr. Excell Seltzer. She is a former patient of Dr. Ronnald Nian. She has known CAD with prior inferior MI treated with nondrug eluting stent to distal RCA. Remains on Plavix per her choice. Other issues include HLD, past tobacco abuse and anemia. She has not followed up with GYN as recommended on several occasions.   She comes in today. She is here to get her medicines refilled. She is doing ok. No chest pain. Not short of breath. BP at home is better but she has not been checking in a while. Has had labs by Dr. Nicholos Johns at [place] in the last 3 months. Not exercising. Weight continues to climb. She says she is going to try and do better with the New Year. Not smoking. Still with very heavy menses. Has not seen GYN in several years. She is considering multiple teeth extractions. She wishes to remain on her Plavix.   Current Outpatient Prescriptions on File Prior to Visit  Medication Sig Dispense Refill  . aspirin 325 MG tablet Take 325 mg by mouth daily.       Marland Kitchen atorvastatin (LIPITOR) 10 MG tablet Take 1 tablet (10 mg total) by mouth daily.  90 tablet  0  . clopidogrel (PLAVIX) 75 MG tablet TAKE 1 TABLET (75 MG TOTAL) BY MOUTH DAILY.  30 tablet  3  . metoprolol tartrate (LOPRESSOR) 25 MG tablet Take 1 tablet (25 mg total) by mouth 2 (two) times daily.  30 tablet  0  . niacin (NIASPAN) 500 MG CR tablet Take 1 tablet (500 mg total) by mouth at bedtime.  90 tablet  3  . nitroGLYCERIN (NITROSTAT) 0.4 MG SL tablet Place 1 tablet (0.4 mg total) under the tongue every 5 (five) minutes as needed for chest pain.  25 tablet  12    Allergies  Allergen Reactions  . Zithromax (Azithromycin)     Past Medical History  Diagnosis Date  . CAD (coronary artery disease)   . Obese   . HTN (hypertension)   . Hyperlipemia   . History of anemia   .  Inferior MI July 2011    with BMS to distal RCA; EF is normal.  . Past use of tobacco     Past Surgical History  Procedure Date  . Coronary stent placement July 2011    distal RCA; EF is normal.     History  Smoking status  . Former Smoker  . Types: Cigarettes  . Quit date: 02/20/2010  Smokeless tobacco  . Not on file    History  Alcohol Use No    History reviewed. No pertinent family history.  Review of Systems: The review of systems is per the HPI.  All other systems were reviewed and are negative.  Physical Exam: BP 160/90  Pulse 64  Ht 5\' 2"  (1.575 m)  Wt 237 lb 1.9 oz (107.557 kg)  BMI 43.37 kg/m2 Patient is very pleasant and in no acute distress. Weight continues to climb. Skin is warm and dry. Color is normal.  HEENT is unremarkable. Normocephalic/atraumatic. PERRL. Sclera are nonicteric. Neck is supple. No masses. No JVD. Lungs are clear. Cardiac exam shows a regular rate and rhythm. Abdomen is obese but soft. Extremities are without edema. Gait and ROM are intact. No gross neurologic deficits noted.   LABORATORY DATA:  Lab Results  Component Value Date   WBC 6.8 05/23/2011   HGB 8.8* 05/23/2011   HCT 28.2* 05/23/2011   PLT 388.0 05/23/2011   GLUCOSE 106* 05/23/2011   CHOL 117 05/23/2011   TRIG 165.0* 05/23/2011   HDL 35.70* 05/23/2011   LDLCALC 48 05/23/2011   ALT 18 05/23/2011   AST 19 05/23/2011   NA 139 05/23/2011   K 4.0 05/23/2011   CL 107 05/23/2011   CREATININE 1.0 05/23/2011   BUN 12 05/23/2011   CO2 26 05/23/2011   INR 1.21 02/20/2010     Assessment / Plan: 1. CAD - no symptoms. Wishes to remain on her Plavix. She would need to stop for 5 to 7 days if she wishes to have her teeth extracted. Medicines are refilled today. Strongly encouraged risk factor modification.   2. Elevated BP - she is to check at home more closely over the next 2 weeks and then call with an update and possible addition of medicines. Goal is to be less than  135/85.   3. HLD - has had labs in the last 3 months per Dr. Nicholos Johns.  4. Anemia - GYN evaluation is recommended once again.   Will see her back in a year. Patient is agreeable to this plan and will call if any problems develop in the interim.

## 2012-08-28 ENCOUNTER — Other Ambulatory Visit: Payer: Self-pay | Admitting: *Deleted

## 2012-08-28 MED ORDER — CLOPIDOGREL BISULFATE 75 MG PO TABS: 75.0000 mg | ORAL_TABLET | Freq: Every day | ORAL | Status: DC

## 2013-07-23 ENCOUNTER — Other Ambulatory Visit: Payer: Self-pay | Admitting: Nurse Practitioner

## 2013-08-12 ENCOUNTER — Other Ambulatory Visit: Payer: Self-pay | Admitting: Nurse Practitioner

## 2013-08-13 ENCOUNTER — Other Ambulatory Visit: Payer: Self-pay | Admitting: *Deleted

## 2013-08-13 MED ORDER — METOPROLOL TARTRATE 25 MG PO TABS: ORAL_TABLET | ORAL | Status: DC

## 2013-08-16 ENCOUNTER — Ambulatory Visit (INDEPENDENT_AMBULATORY_CARE_PROVIDER_SITE_OTHER): Payer: BC Managed Care – PPO | Admitting: Nurse Practitioner

## 2013-08-16 ENCOUNTER — Encounter: Payer: Self-pay | Admitting: Nurse Practitioner

## 2013-08-16 VITALS — BP 150/100 | HR 72 | Ht 62.0 in | Wt 245.1 lb

## 2013-08-16 DIAGNOSIS — I259 Chronic ischemic heart disease, unspecified: Secondary | ICD-10-CM

## 2013-08-16 DIAGNOSIS — E782 Mixed hyperlipidemia: Secondary | ICD-10-CM

## 2013-08-16 LAB — BASIC METABOLIC PANEL
BUN: 13 mg/dL (ref 6–23)
CO2: 25 mEq/L (ref 19–32)
Calcium: 9.4 mg/dL (ref 8.4–10.5)
Chloride: 104 mEq/L (ref 96–112)
Creatinine, Ser: 0.9 mg/dL (ref 0.4–1.2)
GFR: 70.24 mL/min (ref >60.00)
Glucose, Bld: 77 mg/dL (ref 70–99)
Potassium: 4.5 mEq/L (ref 3.5–5.1)
Sodium: 137 mEq/L (ref 135–145)

## 2013-08-16 LAB — CBC WITH DIFFERENTIAL/PLATELET
Basophils Absolute: 0 10*3/uL (ref 0.0–0.1)
Basophils Relative: 0.4 % (ref 0.0–3.0)
Eosinophils Absolute: 0.4 10*3/uL (ref 0.0–0.7)
Eosinophils Relative: 4.1 % (ref 0.0–5.0)
HCT: 34.5 % — ABNORMAL LOW (ref 36.0–46.0)
Hemoglobin: 11.4 g/dL — ABNORMAL LOW (ref 12.0–15.0)
Lymphocytes Relative: 29.5 % (ref 12.0–46.0)
Lymphs Abs: 3.1 10*3/uL (ref 0.7–4.0)
MCHC: 33.1 g/dL (ref 30.0–36.0)
MCV: 81.5 fl (ref 78.0–100.0)
Monocytes Absolute: 0.8 10*3/uL (ref 0.1–1.0)
Monocytes Relative: 7.7 % (ref 3.0–12.0)
Neutro Abs: 6.2 10*3/uL (ref 1.4–7.7)
Neutrophils Relative %: 58.3 % (ref 43.0–77.0)
Platelets: 365 10*3/uL (ref 150.0–400.0)
RBC: 4.23 Mil/uL (ref 3.87–5.11)
RDW: 14.1 % (ref 11.5–14.6)
WBC: 10.6 10*3/uL — ABNORMAL HIGH (ref 4.5–10.5)

## 2013-08-16 LAB — LIPID PANEL
Cholesterol: 161 mg/dL (ref 0–200)
HDL: 35.9 mg/dL — ABNORMAL LOW (ref >39.00)
Total CHOL/HDL Ratio: 4
Triglycerides: 233 mg/dL — ABNORMAL HIGH (ref 0.0–149.0)
VLDL: 46.6 mg/dL — ABNORMAL HIGH (ref 0.0–40.0)

## 2013-08-16 LAB — LDL CHOLESTEROL, DIRECT: Direct LDL: 92.9 mg/dL

## 2013-08-16 LAB — HEPATIC FUNCTION PANEL
ALT: 20 U/L (ref 0–35)
AST: 18 U/L (ref 0–37)
Albumin: 3.9 g/dL (ref 3.5–5.2)
Alkaline Phosphatase: 50 U/L (ref 39–117)
Bilirubin, Direct: 0 mg/dL (ref 0.0–0.3)
Total Bilirubin: 0.4 mg/dL (ref 0.3–1.2)
Total Protein: 7.7 g/dL (ref 6.0–8.3)

## 2013-08-16 MED ORDER — METOPROLOL TARTRATE 25 MG PO TABS: ORAL_TABLET | ORAL | Status: DC

## 2013-08-16 MED ORDER — CLOPIDOGREL BISULFATE 75 MG PO TABS
75.0000 mg | ORAL_TABLET | Freq: Every day | ORAL | Status: DC
Start: 2013-08-16 — End: 2014-09-27

## 2013-08-16 MED ORDER — HYDROCHLOROTHIAZIDE 25 MG PO TABS: 25.0000 mg | ORAL_TABLET | Freq: Every day | ORAL | Status: DC

## 2013-08-16 NOTE — Patient Instructions (Addendum)
We will check labs today  I am stopping your Niaspan  I am adding HCTZ 25 mg to take each morning - to help with your blood pressure  Here are my tips to lose weight:  1. Drink only water. You do not need milk, juice, tea, soda or diet soda.  2. Do not eat anything "white". This includes white bread, potatoes, rice or mayo  3. Stay away from fried foods and sweets  4. Your portion should be the size of the palm of your hand.  5. Know what your weaknesses are and avoid.  6. Find an exercise you like and do it every day for 45 to 60 minutes.       See me in a month with lab (do not need to fast)  I have refilled your Lipitor and your Plavix  Call the [facility] office at [phone] if you have any questions, problems or concerns.

## 2013-08-16 NOTE — Progress Notes (Signed)
Lynita LombardGlenda B Goree Date of Birth: Nov 30, 1970 Medical Record #[id number]  History of Present Illness: Annette Singleton is seen back today for a one year check. Seen for Dr. Excell Seltzerooper - former patient of Dr. Ronnald Nianennant's. She has known CAD with prior inferior MI treated with BMS to the distal RCA - remained on Plavix at her request. Other issues include HLD, tobacco abuse and anemia. Has been asked several times by myself to follow up with GYN for her anemia to no avail.   Seen over a year ago - was doing ok from our standpoint. She again wished to remain on Plavix chronically.   Comes back today. Here with her husband. Doing ok. No chest pain. Not short of breath. Not exercising. Not smoking. Says her BP is ok at home but not checking her BP at home. Has not run out of her medicines yet.   Current Outpatient Prescriptions  Medication Sig Dispense Refill  . aspirin 325 MG tablet Take 325 mg by mouth daily.       . clopidogrel (PLAVIX) 75 MG tablet Take 1 tablet (75 mg total) by mouth daily.  90 tablet  3  . ferrous sulfate 325 (65 FE) MG tablet Take 325 mg by mouth 2 (two) times daily with a meal.      . metoprolol tartrate (LOPRESSOR) 25 MG tablet TAKE 1 TABLET (25 MG TOTAL) BY MOUTH 2 (TWO) TIMES DAILY.  60 tablet  0  . niacin (NIASPAN) 500 MG CR tablet Take 1 tablet (500 mg total) by mouth at bedtime.  90 tablet  3  . atorvastatin (LIPITOR) 10 MG tablet Take 1 tablet (10 mg total) by mouth daily.  90 tablet  3  . nitroGLYCERIN (NITROSTAT) 0.4 MG SL tablet Place 1 tablet (0.4 mg total) under the tongue every 5 (five) minutes as needed for chest pain.  25 tablet  12   No current facility-administered medications for this visit.    Allergies  Allergen Reactions  . Zithromax [Azithromycin]     Past Medical History  Diagnosis Date  . CAD (coronary artery disease)   . Obese   . HTN (hypertension)   . Hyperlipemia   . History of anemia   . Inferior MI July 2011    with BMS to distal RCA; EF is normal.    . Past use of tobacco     Past Surgical History  Procedure Laterality Date  . Coronary stent placement  July 2011    distal RCA; EF is normal.     History  Smoking status  . Former Smoker  . Types: Cigarettes  . Quit date: 02/20/2010  Smokeless tobacco  . Not on file    History  Alcohol Use No    No family history on file.  Review of Systems: The review of systems is per the HPI.  All other systems were reviewed and are negative.  Physical Exam: BP 150/100  Pulse 72  Ht 5\' 2"  (1.575 m)  Wt 245 lb 1.9 oz (111.186 kg)  BMI 44.82 kg/m2 BP is 164/100 by me.  Patient is alert and in no acute distress. She is obese - weight up 8 more pounds. Skin is warm and dry. Color is normal.  HEENT is unremarkable. Normocephalic/atraumatic. PERRL. Sclera are nonicteric. Neck is supple. No masses. No JVD. Lungs are clear. Cardiac exam shows a regular rate and rhythm. Abdomen is soft. Extremities are without edema. Gait and ROM are intact. No gross neurologic deficits noted.  Wt  Readings from Last 3 Encounters:  08/16/13 245 lb 1.9 oz (111.186 kg)  08/04/12 237 lb 1.9 oz (107.557 kg)  05/23/11 223 lb 6.4 oz (101.334 kg)     LABORATORY DATA:  Lab Results  Component Value Date   WBC 6.8 05/23/2011   HGB 8.8* 05/23/2011   HCT 28.2* 05/23/2011   PLT 388.0 05/23/2011   GLUCOSE 106* 05/23/2011   CHOL 117 05/23/2011   TRIG 165.0* 05/23/2011   HDL 35.70* 05/23/2011   LDLCALC 48 05/23/2011   ALT 18 05/23/2011   AST 19 05/23/2011   NA 139 05/23/2011   K 4.0 05/23/2011   CL 107 05/23/2011   CREATININE 1.0 05/23/2011   BUN 12 05/23/2011   CO2 26 05/23/2011   INR 1.21 02/20/2010     Assessment / Plan: 1. CAD - past inferior MI treated with BMS to the distal RCA - currently with not symptoms.   2. HLD - I have left her on her statin - based on the recent findings with Niaspan I have stopped this agent. Check fasting labs today.  3. Anemia - now on some iron  4. HTN - BP not  controlled - she is not on enough medicines. Adding HCTZ 25 mg daily - see her back in a month with a BMET.  5. Past tobacco abuse - resolved  6. Obesity - long talk about diet/need for exercise, etc. Do not get the feeling she is motivated to make changes.   Patient is agreeable to this plan and will call if any problems develop in the interim.   Rosalio Macadamia, RN, ANP-C [facility] [address] [place], [place]  [address] [phone]

## 2013-09-11 ENCOUNTER — Other Ambulatory Visit: Payer: Self-pay | Admitting: Nurse Practitioner

## 2013-09-14 ENCOUNTER — Ambulatory Visit: Payer: BC Managed Care – PPO | Admitting: Nurse Practitioner

## 2013-10-30 ENCOUNTER — Other Ambulatory Visit: Payer: Self-pay | Admitting: Nurse Practitioner

## 2014-08-06 ENCOUNTER — Other Ambulatory Visit: Payer: Self-pay | Admitting: Nurse Practitioner

## 2014-08-16 ENCOUNTER — Other Ambulatory Visit: Payer: Self-pay

## 2014-08-16 MED ORDER — HYDROCHLOROTHIAZIDE 25 MG PO TABS: 25.0000 mg | ORAL_TABLET | Freq: Every day | ORAL | Status: DC

## 2014-09-06 ENCOUNTER — Ambulatory Visit: Payer: Self-pay | Admitting: Nurse Practitioner

## 2014-09-09 ENCOUNTER — Other Ambulatory Visit: Payer: Self-pay | Admitting: Nurse Practitioner

## 2014-09-12 ENCOUNTER — Other Ambulatory Visit: Payer: Self-pay

## 2014-09-12 MED ORDER — METOPROLOL TARTRATE 25 MG PO TABS: ORAL_TABLET | ORAL | Status: DC

## 2014-09-27 ENCOUNTER — Encounter: Payer: Self-pay | Admitting: Nurse Practitioner

## 2014-09-27 ENCOUNTER — Other Ambulatory Visit: Payer: Self-pay

## 2014-09-27 ENCOUNTER — Ambulatory Visit (INDEPENDENT_AMBULATORY_CARE_PROVIDER_SITE_OTHER): Payer: BLUE CROSS/BLUE SHIELD | Admitting: Nurse Practitioner

## 2014-09-27 VITALS — BP 138/80 | HR 71 | Ht 62.0 in | Wt 233.8 lb

## 2014-09-27 DIAGNOSIS — I2510 Atherosclerotic heart disease of native coronary artery without angina pectoris: Secondary | ICD-10-CM

## 2014-09-27 DIAGNOSIS — E785 Hyperlipidemia, unspecified: Secondary | ICD-10-CM

## 2014-09-27 DIAGNOSIS — I10 Essential (primary) hypertension: Secondary | ICD-10-CM

## 2014-09-27 DIAGNOSIS — I259 Chronic ischemic heart disease, unspecified: Secondary | ICD-10-CM

## 2014-09-27 LAB — HEPATIC FUNCTION PANEL
ALT: 15 U/L (ref 0–35)
AST: 18 U/L (ref 0–37)
Albumin: 4 g/dL (ref 3.5–5.2)
Alkaline Phosphatase: 45 U/L (ref 39–117)
Bilirubin, Direct: 0.1 mg/dL (ref 0.0–0.3)
Total Bilirubin: 0.3 mg/dL (ref 0.2–1.2)
Total Protein: 7.6 g/dL (ref 6.0–8.3)

## 2014-09-27 LAB — BASIC METABOLIC PANEL
BUN: 18 mg/dL (ref 6–23)
CO2: 30 mEq/L (ref 19–32)
Calcium: 9.3 mg/dL (ref 8.4–10.5)
Chloride: 101 mEq/L (ref 96–112)
Creatinine, Ser: 1.01 mg/dL (ref 0.40–1.20)
GFR: 63.52 mL/min (ref >60.00)
Glucose, Bld: 97 mg/dL (ref 70–99)
Potassium: 3.5 mEq/L (ref 3.5–5.1)
Sodium: 136 mEq/L (ref 135–145)

## 2014-09-27 LAB — LIPID PANEL
Cholesterol: 196 mg/dL (ref 0–200)
HDL: 34.3 mg/dL — ABNORMAL LOW (ref >39.00)
NonHDL: 161.7
Total CHOL/HDL Ratio: 6
Triglycerides: 225 mg/dL — ABNORMAL HIGH (ref 0.0–149.0)
VLDL: 45 mg/dL — ABNORMAL HIGH (ref 0.0–40.0)

## 2014-09-27 LAB — LDL CHOLESTEROL, DIRECT: Direct LDL: 126 mg/dL

## 2014-09-27 MED ORDER — HYDROCHLOROTHIAZIDE 25 MG PO TABS: 25.0000 mg | ORAL_TABLET | Freq: Every day | ORAL | Status: DC

## 2014-09-27 MED ORDER — NITROGLYCERIN 0.4 MG SL SUBL: 0.4000 mg | SUBLINGUAL_TABLET | SUBLINGUAL | Status: DC | PRN

## 2014-09-27 MED ORDER — ATORVASTATIN CALCIUM 20 MG PO TABS: 20.0000 mg | ORAL_TABLET | Freq: Every day | ORAL | Status: DC

## 2014-09-27 MED ORDER — METOPROLOL TARTRATE 25 MG PO TABS: ORAL_TABLET | ORAL | Status: DC

## 2014-09-27 MED ORDER — ATORVASTATIN CALCIUM 10 MG PO TABS: ORAL_TABLET | ORAL | Status: DC

## 2014-09-27 NOTE — Progress Notes (Signed)
CARDIOLOGY OFFICE NOTE  Date:  09/27/2014    Annette Singleton Date of Birth: Sep 21, 1970 Medical Record #[id number]  PCP:  Lolita Patella, MD  Cardiologist:  Excell Seltzer    Chief Complaint  Patient presents with  . Coronary Artery Disease    Follow up visit     History of Present Illness: Annette Singleton is a 44 y.o. female who presents today for a follow up visit. This is a one year check.  Seen for Dr. Excell Seltzer - former patient of Dr. Ronnald Nian. She has known CAD with prior inferior MI treated with BMS to the distal RCA - remained on Plavix at her request. Other issues include HLD, tobacco abuse and anemia. Has been asked several times by myself to follow up with GYN for her anemia to no avail. She has had multiple "no show" appointments.   Seen over a year ago - was doing ok from our standpoint. She again wished to remain on Plavix chronically. Her BP was up - I increased her medicines - she did not keep her follow up visit to recheck.   Comes back today. Here alone today. Needs medicines refilled. She has done well over the past year. No chest pain. Not short of breath. She says she is exercising "somewhat". She has cut back on her portions - drinking more water and has lost some weight. She feels like she has had a good year. BP has done well. No longer on her Plavix - says I stopped. She is almost 5 years out from her stent.   Past Medical History  Diagnosis Date  . CAD (coronary artery disease)   . Obese   . HTN (hypertension)   . Hyperlipemia   . History of anemia   . Inferior MI July 2011    with BMS to distal RCA; EF is normal.  . Past use of tobacco     Past Surgical History  Procedure Laterality Date  . Coronary stent placement  July 2011    distal RCA; EF is normal.      Medications: Current Outpatient Prescriptions  Medication Sig Dispense Refill  . aspirin 325 MG tablet Take 325 mg by mouth daily.     Marland Kitchen atorvastatin (LIPITOR) 10 MG tablet TAKE 1  TABLET (10 MG TOTAL) BY MOUTH DAILY. 90 tablet 3  . ferrous sulfate 325 (65 FE) MG tablet Take 325 mg by mouth 2 (two) times daily with a meal.    . hydrochlorothiazide (HYDRODIURIL) 25 MG tablet Take 1 tablet (25 mg total) by mouth daily. 90 tablet 3  . metoprolol tartrate (LOPRESSOR) 25 MG tablet TAKE 1 TABLET (25 MG TOTAL) BY MOUTH 2 (TWO) TIMES DAILY. 180 tablet 3  . nitroGLYCERIN (NITROSTAT) 0.4 MG SL tablet Place 1 tablet (0.4 mg total) under the tongue every 5 (five) minutes as needed for chest pain. 25 tablet 12   No current facility-administered medications for this visit.    Allergies: Allergies  Allergen Reactions  . Zithromax [Azithromycin]     Social History: The patient  reports that she quit smoking about 4 years ago. Her smoking use included Cigarettes. She does not have any smokeless tobacco history on file. She reports that she does not drink alcohol or use illicit drugs.   Family History: The patient's family history is not on file.   Review of Systems: Please see the history of present illness.      All other systems are reviewed and negative.  Physical Exam: VS:  BP 138/80 mmHg  Pulse 71  Ht 5\' 2"  (1.575 m)  Wt 233 lb 12.8 oz (106.051 kg)  BMI 42.75 kg/m2 .  BMI Body mass index is 42.75 kg/(m^2).  Wt Readings from Last 3 Encounters:  09/27/14 233 lb 12.8 oz (106.051 kg)  08/16/13 245 lb 1.9 oz (111.186 kg)  08/04/12 237 lb 1.9 oz (107.557 kg)    General: Pleasant. Well developed, well nourished and in no acute distress. She has lost 12 pounds.  HEENT: Normal. Neck: Supple, no JVD, carotid bruits, or masses noted.  Cardiac: Regular rate and rhythm. No murmurs, rubs, or gallops. No edema.  Respiratory:  Lungs are clear to auscultation bilaterally with normal work of breathing.  GI: Soft and nontender.  MS: No deformity or atrophy. Gait and ROM intact. Skin: Warm and dry. Color is normal.  Neuro:  Strength and sensation are intact and no gross focal  deficits noted.  Psych: Alert, appropriate and with normal affect.   LABORATORY DATA:  EKG:  EKG is ordered today. This demonstrates NSR. She has inferior lateral changes - unchanged from past tracing.   Lab Results  Component Value Date   WBC 10.6* 08/16/2013   HGB 11.4* 08/16/2013   HCT 34.5* 08/16/2013   PLT 365.0 08/16/2013   GLUCOSE 77 08/16/2013   CHOL 161 08/16/2013   TRIG 233.0* 08/16/2013   HDL 35.90* 08/16/2013   LDLDIRECT 92.9 08/16/2013   LDLCALC 48 05/23/2011   ALT 20 08/16/2013   AST 18 08/16/2013   NA 137 08/16/2013   K 4.5 08/16/2013   CL 104 08/16/2013   CREATININE 0.9 08/16/2013   BUN 13 08/16/2013   CO2 25 08/16/2013   INR 1.21 02/20/2010    BNP (last 3 results) No results for input(s): BNP in the last 8760 hours.  ProBNP (last 3 results) No results for input(s): PROBNP in the last 8760 hours.   Other Studies Reviewed Today:  N/A   Assessment/Plan: 1. CAD - past inferior MI treated with BMS to the distal RCA - currently with not symptoms. She is off her Plavix. Just on aspirin.   2. HLD - I have left her on her statin - rechecking labs today  3. Anemia - remains on some iron  4. HTN - BP not controlled - she is not on enough medicines. Adding HCTZ 25 mg daily - see her back in a month with a BMET.  5. Past tobacco abuse - resolved  6. Obesity - long talk again about diet/need for exercise, etc. She has made some changes - hopefully she will keep up the good work  7. Noncompliance   Current medicines are reviewed with the patient today.  The patient does not have concerns regarding medicines other than what has been noted above.  The following changes have been made:  See above.  Labs/ tests ordered today include:    Orders Placed This Encounter  Procedures  . Basic metabolic panel  . Hepatic function panel  . Lipid panel  . EKG 12-Lead    Disposition:   FU with me in 1 year.  Patient is agreeable to this plan and will call  if any problems develop in the interim.   Signed: Rosalio MacadamiaLori C. Ilia Engelbert, RN, ANP-C 09/27/2014 8:59 AM  [facility] [address] [place], [place]  [address] Phone: [phone] Fax: [phone]

## 2014-09-27 NOTE — Patient Instructions (Addendum)
We will be checking the following labs today BMET, Lipids, HPF  Stay on your current medicines - I have refilled these today  See me in one year  Stay active and keep working on your weight.   Call the [facility] office at [phone] if you have any questions, problems or concerns.

## 2014-10-09 ENCOUNTER — Other Ambulatory Visit: Payer: Self-pay | Admitting: Cardiovascular Disease

## 2014-12-26 ENCOUNTER — Other Ambulatory Visit: Payer: BLUE CROSS/BLUE SHIELD

## 2015-02-14 ENCOUNTER — Other Ambulatory Visit: Payer: Self-pay | Admitting: Cardiovascular Disease

## 2015-02-18 ENCOUNTER — Other Ambulatory Visit: Payer: Self-pay | Admitting: Cardiovascular Disease

## 2015-10-08 ENCOUNTER — Other Ambulatory Visit: Payer: Self-pay | Admitting: Nurse Practitioner

## 2015-11-11 ENCOUNTER — Other Ambulatory Visit: Payer: Self-pay | Admitting: Nurse Practitioner

## 2015-12-27 ENCOUNTER — Other Ambulatory Visit: Payer: Self-pay | Admitting: *Deleted

## 2015-12-27 ENCOUNTER — Telehealth: Payer: Self-pay | Admitting: Nurse Practitioner

## 2015-12-27 MED ORDER — HYDROCHLOROTHIAZIDE 25 MG PO TABS: ORAL_TABLET | ORAL | Status: DC

## 2015-12-27 NOTE — Telephone Encounter (Signed)
°*  STAT* If patient is at the pharmacy, call can be transferred to refill team.   1. Which medications need to be refilled? (please list name of each medication and dose if known) Hydrochlorothiacide 25mg   2. Which pharmacy/location (including street and city if local pharmacy) is medication to be sent to? [facility]  3. Do they need a 30 day or 90 day supply? 90day

## 2016-01-09 ENCOUNTER — Other Ambulatory Visit: Payer: Self-pay | Admitting: Nurse Practitioner

## 2016-01-11 ENCOUNTER — Encounter: Payer: Self-pay | Admitting: Nurse Practitioner

## 2016-01-16 ENCOUNTER — Encounter: Payer: Self-pay | Admitting: Nurse Practitioner

## 2016-01-16 ENCOUNTER — Ambulatory Visit (INDEPENDENT_AMBULATORY_CARE_PROVIDER_SITE_OTHER): Payer: BLUE CROSS/BLUE SHIELD | Admitting: Nurse Practitioner

## 2016-01-16 VITALS — BP 160/100 | HR 80 | Ht 62.0 in | Wt 239.1 lb

## 2016-01-16 DIAGNOSIS — E785 Hyperlipidemia, unspecified: Secondary | ICD-10-CM | POA: Diagnosis not present

## 2016-01-16 DIAGNOSIS — I10 Essential (primary) hypertension: Secondary | ICD-10-CM | POA: Diagnosis not present

## 2016-01-16 DIAGNOSIS — I259 Chronic ischemic heart disease, unspecified: Secondary | ICD-10-CM

## 2016-01-16 DIAGNOSIS — I2510 Atherosclerotic heart disease of native coronary artery without angina pectoris: Secondary | ICD-10-CM

## 2016-01-16 LAB — BASIC METABOLIC PANEL
BUN: 18 mg/dL (ref 7–25)
CO2: 28 mmol/L (ref 20–31)
Calcium: 9.1 mg/dL (ref 8.6–10.2)
Chloride: 100 mmol/L (ref 98–110)
Creat: 1.09 mg/dL (ref 0.50–1.10)
Glucose, Bld: 104 mg/dL — ABNORMAL HIGH (ref 65–99)
Potassium: 3.8 mmol/L (ref 3.5–5.3)
Sodium: 135 mmol/L (ref 135–146)

## 2016-01-16 LAB — CBC
HCT: 37.4 % (ref 35.0–45.0)
Hemoglobin: 13 g/dL (ref 11.7–15.5)
MCH: 28.9 pg (ref 27.0–33.0)
MCHC: 34.8 g/dL (ref 32.0–36.0)
MCV: 83.1 fL (ref 80.0–100.0)
MPV: 9.2 fL (ref 7.5–12.5)
Platelets: 374 10*3/uL (ref 140–400)
RBC: 4.5 MIL/uL (ref 3.80–5.10)
RDW: 13.6 % (ref 11.0–15.0)
WBC: 7.4 10*3/uL (ref 3.8–10.8)

## 2016-01-16 LAB — LIPID PANEL
Cholesterol: 182 mg/dL (ref 125–200)
HDL: 31 mg/dL — ABNORMAL LOW (ref >=46)
LDL Cholesterol: 104 mg/dL (ref <130)
Total CHOL/HDL Ratio: 5.9 Ratio — ABNORMAL HIGH (ref <=5.0)
Triglycerides: 233 mg/dL — ABNORMAL HIGH (ref <150)
VLDL: 47 mg/dL — ABNORMAL HIGH (ref <30)

## 2016-01-16 LAB — HEPATIC FUNCTION PANEL
ALT: 35 U/L — ABNORMAL HIGH (ref 6–29)
AST: 26 U/L (ref 10–30)
Albumin: 4.2 g/dL (ref 3.6–5.1)
Alkaline Phosphatase: 54 U/L (ref 33–115)
Bilirubin, Direct: 0.1 mg/dL (ref <=0.2)
Indirect Bilirubin: 0.5 mg/dL (ref 0.2–1.2)
Total Bilirubin: 0.6 mg/dL (ref 0.2–1.2)
Total Protein: 7.5 g/dL (ref 6.1–8.1)

## 2016-01-16 MED ORDER — AMLODIPINE BESYLATE 5 MG PO TABS: 5.0000 mg | ORAL_TABLET | Freq: Every day | ORAL | Status: DC

## 2016-01-16 MED ORDER — METOPROLOL TARTRATE 25 MG PO TABS: ORAL_TABLET | ORAL | Status: DC

## 2016-01-16 MED ORDER — NITROGLYCERIN 0.4 MG SL SUBL: 0.4000 mg | SUBLINGUAL_TABLET | SUBLINGUAL | Status: DC | PRN

## 2016-01-16 MED ORDER — HYDROCHLOROTHIAZIDE 25 MG PO TABS: 25.0000 mg | ORAL_TABLET | Freq: Every day | ORAL | Status: DC

## 2016-01-16 MED ORDER — ATORVASTATIN CALCIUM 20 MG PO TABS: 20.0000 mg | ORAL_TABLET | Freq: Every day | ORAL | Status: DC

## 2016-01-16 NOTE — Progress Notes (Signed)
CARDIOLOGY OFFICE NOTE  Date:  01/16/2016    Annette Singleton Date of Birth: Jun 28, 1971 Medical Record #[id number]  PCP:  Lolita Patella, MD  Cardiologist:  Kirt Boys    Chief Complaint  Patient presents with  . Coronary Artery Disease  . Hyperlipidemia  . Hypertension    15 month check - seen for Dr. Excell Seltzer    History of Present Illness: Annette Singleton is a 45 y.o. female who presents today for a follow up visit. This is an approximate 15 month check.  Seen for Dr. Excell Seltzer - former patient of Dr. Ronnald Nian.  She has known CAD with prior inferior MI treated with BMS to the distal RCA in 2011  Other issues include HLD, tobacco abuse and anemia. Has been asked several times by myself to follow up with GYN for her anemia to no avail. She has had multiple "no show" appointments and is noncompliant. Usually only comes to get her medicines.   Seen over a year ago - was doing ok from our standpoint.  Her BP was up - I increased her medicines - she did not keep her follow up visit to recheck.   Comes back today. Here alone today. Needs medicines refilled again. BP high again. Says she is taking but I am really not convinced. No chest pain. Not short of breath. BP high at home as well - was 147/100 this morning. Tries to not eat much salt. Weight continues to climb. Does not exercise. Her job is moving to [employer] - she is not happy about this.   Past Medical History  Diagnosis Date  . CAD (coronary artery disease)   . Obese   . HTN (hypertension)   . Hyperlipemia   . History of anemia   . Inferior MI [facility]) July 2011    with BMS to distal RCA; EF is normal.  . Past use of tobacco     Past Surgical History  Procedure Laterality Date  . Coronary stent placement  July 2011    distal RCA; EF is normal.      Medications: Current Outpatient Prescriptions  Medication Sig Dispense Refill  . aspirin 325 MG tablet Take 325 mg by mouth daily.     Marland Kitchen atorvastatin  (LIPITOR) 20 MG tablet Take 1 tablet (20 mg total) by mouth daily. 90 tablet 3  . ferrous sulfate 325 (65 FE) MG tablet Take 325 mg by mouth 2 (two) times daily with a meal.    . hydrochlorothiazide (HYDRODIURIL) 25 MG tablet Take 1 tablet (25 mg total) by mouth daily. 90 tablet 3  . metoprolol tartrate (LOPRESSOR) 25 MG tablet TAKE 1 TABLET (25 MG TOTAL) BY MOUTH 2 (TWO) TIMES DAILY. 180 tablet 3  . nitroGLYCERIN (NITROSTAT) 0.4 MG SL tablet Place 1 tablet (0.4 mg total) under the tongue every 5 (five) minutes as needed for chest pain. 25 tablet 12  . amLODipine (NORVASC) 5 MG tablet Take 1 tablet (5 mg total) by mouth daily. 90 tablet 3   No current facility-administered medications for this visit.    Allergies: Allergies  Allergen Reactions  . Zithromax [Azithromycin]     Social History: The patient  reports that she quit smoking about 5 years ago. Her smoking use included Cigarettes. She does not have any smokeless tobacco history on file. She reports that she does not drink alcohol or use illicit drugs.   Family History: The patient's family history is not on file.  Review of Systems: Please see the history of present illness.   Otherwise, the review of systems is positive for none.   All other systems are reviewed and negative.   Physical Exam: VS:  BP 160/100 mmHg  Pulse 80  Ht 5\' 2"  (1.575 m)  Wt 239 lb 1.9 oz (108.464 kg)  BMI 43.72 kg/m2 .  BMI Body mass index is 43.72 kg/(m^2).  Wt Readings from Last 3 Encounters:  01/16/16 239 lb 1.9 oz (108.464 kg)  09/27/14 233 lb 12.8 oz (106.051 kg)  08/16/13 245 lb 1.9 oz (111.186 kg)   BP recheck by me is unchanged.  General: Obese. She is alert and in no acute distress.  HEENT: Normal. Neck: Supple, no JVD, carotid bruits, or masses noted.  Cardiac: Regular rate and rhythm. No murmurs, rubs, or gallops. No edema.  Respiratory:  Lungs are clear to auscultation bilaterally with normal work of breathing.  GI: Soft and  nontender.  MS: No deformity or atrophy. Gait and ROM intact. Skin: Warm and dry. Color is normal.  Neuro:  Strength and sensation are intact and no gross focal deficits noted.  Psych: Alert, appropriate and with normal affect.   LABORATORY DATA:  EKG:  EKG is ordered today. This demonstrates NSR with prior inferior MI and nonspecific ST/T wave changes - unchanged.  Lab Results  Component Value Date   WBC 10.6* 08/16/2013   HGB 11.4* 08/16/2013   HCT 34.5* 08/16/2013   PLT 365.0 08/16/2013   GLUCOSE 97 09/27/2014   CHOL 196 09/27/2014   TRIG 225.0* 09/27/2014   HDL 34.30* 09/27/2014   LDLDIRECT 126.0 09/27/2014   LDLCALC 48 05/23/2011   ALT 15 09/27/2014   AST 18 09/27/2014   NA 136 09/27/2014   K 3.5 09/27/2014   CL 101 09/27/2014   CREATININE 1.01 09/27/2014   BUN 18 09/27/2014   CO2 30 09/27/2014   INR 1.21 02/20/2010    BNP (last 3 results) No results for input(s): BNP in the last 8760 hours.  ProBNP (last 3 results) No results for input(s): PROBNP in the last 8760 hours.   Other Studies Reviewed Today:   Assessment/Plan: 1. CAD - past inferior MI treated with BMS to the distal RCA - currently with not symptoms. She is off her Plavix. Just on aspirin. Will continue with her current plan of care.   2. HLD - I have left her on her statin - rechecking labs today  3. Anemia - remains on some iron - has declined GYN visits  4. HTN - BP not controlled - she is not on enough medicines. Norvasc is added today. Would like to recheck her in a month - explained that this is for her benefit and to do well long term.  5. Past tobacco abuse - resolved  6. Obesity - long talk again about diet/need for exercise, etc.   7. Noncompliance - long standing issue. I suspect most of this is money driven.   Current medicines are reviewed with the patient today.  The patient does not have concerns regarding medicines other than what has been noted above.  The following changes  have been made:  See above.  Labs/ tests ordered today include:    Orders Placed This Encounter  Procedures  . Basic metabolic panel  . CBC  . Hepatic function panel  . Lipid panel  . EKG 12-Lead     Disposition:   FU with me in 1 month.   Patient is agreeable  to this plan and will call if any problems develop in the interim.   Signed: Rosalio Macadamia, RN, ANP-C 01/16/2016 10:19 AM  [facility] [address] [place], [place]  [address] Phone: [phone] Fax: [phone]

## 2016-01-16 NOTE — Patient Instructions (Addendum)
We will be checking the following labs today - BMET, CBC, HPF and Lipids   Medication Instructions:    Continue with your current medicines. BUT  I am adding Norvasc 5 mg a day for your blood pressure - this is at your drug store.     Testing/Procedures To Be Arranged:  N/A  Follow-Up:   See me in one month    Other Special Instructions:   Think about what we talked about today.    If you need a refill on your cardiac medications before your next appointment, please call your pharmacy.   Call the [facility] office at [phone] if you have any questions, problems or concerns.

## 2016-02-01 ENCOUNTER — Encounter: Payer: Self-pay | Admitting: *Deleted

## 2016-02-20 ENCOUNTER — Ambulatory Visit (INDEPENDENT_AMBULATORY_CARE_PROVIDER_SITE_OTHER): Payer: BLUE CROSS/BLUE SHIELD | Admitting: Nurse Practitioner

## 2016-02-20 ENCOUNTER — Encounter: Payer: Self-pay | Admitting: Nurse Practitioner

## 2016-02-20 VITALS — BP 140/80 | HR 78 | Ht 62.0 in | Wt 240.1 lb

## 2016-02-20 DIAGNOSIS — I10 Essential (primary) hypertension: Secondary | ICD-10-CM

## 2016-02-20 DIAGNOSIS — I259 Chronic ischemic heart disease, unspecified: Secondary | ICD-10-CM

## 2016-02-20 DIAGNOSIS — E785 Hyperlipidemia, unspecified: Secondary | ICD-10-CM | POA: Diagnosis not present

## 2016-02-20 DIAGNOSIS — I2510 Atherosclerotic heart disease of native coronary artery without angina pectoris: Secondary | ICD-10-CM | POA: Diagnosis not present

## 2016-02-20 NOTE — Patient Instructions (Addendum)
We will be checking the following labs today - NONE   Medication Instructions:    Continue with your current medicines.     Testing/Procedures To Be Arranged:  N/A  Follow-Up:   See me in 1 year   Other Special Instructions:   Keep a check on your BP for me  Exercise daily!    If you need a refill on your cardiac medications before your next appointment, please call your pharmacy.   Call the [facility] office at [phone] if you have any questions, problems or concerns.

## 2016-02-20 NOTE — Progress Notes (Signed)
CARDIOLOGY OFFICE NOTE  Date:  02/20/2016    Annette Singleton Date of Birth: 08-10-1970 Medical Record #[id number]  PCP:  Lolita Patella, MD  Cardiologist:  Tyrone Sage    Chief Complaint  Patient presents with  . Hyperlipidemia  . Hypertension  . Coronary Artery Disease    1 month check     History of Present Illness: Annette Singleton is a 45 y.o. female who presents today for a one month check. She has known CAD with prior inferior MI treated with BMS to the distal RCA in 2011 Other issues include HLD, tobacco abuse and anemia. Has been asked several times by myself to follow up with GYN for her anemia to no avail. She has had multiple "no show" appointments and is noncompliant. Usually only comes to get her medicines.   Seen over a year ago - was doing ok from our standpoint. Her BP was up - I increased her medicines - she did not keep her follow up visit to recheck.   I saw her last month - BP was high. I added Norvasc.   Comes back today. Here alone today. BP has improved. Lower at home. No chest pain. Not short of breath. Says she feels much better. Not dizzy or lightheaded. She thought she was going to be displaced with her job - this did not turn out to be the case and she is quite happy that she will not have to leave [place]. She is happy with how she is doing.  Past Medical History  Diagnosis Date  . CAD (coronary artery disease)   . Obese   . HTN (hypertension)   . Hyperlipemia   . History of anemia   . Inferior MI [facility]) July 2011    with BMS to distal RCA; EF is normal.  . Past use of tobacco     Past Surgical History  Procedure Laterality Date  . Coronary stent placement  July 2011    distal RCA; EF is normal.      Medications: Current Outpatient Prescriptions  Medication Sig Dispense Refill  . amLODipine (NORVASC) 5 MG tablet Take 1 tablet (5 mg total) by mouth daily. 90 tablet 3  . aspirin 325 MG tablet Take 325 mg by mouth daily.     Marland Kitchen  atorvastatin (LIPITOR) 20 MG tablet Take 1 tablet (20 mg total) by mouth daily. 90 tablet 3  . ferrous sulfate 325 (65 FE) MG tablet Take 325 mg by mouth 2 (two) times daily with a meal.    . hydrochlorothiazide (HYDRODIURIL) 25 MG tablet Take 1 tablet (25 mg total) by mouth daily. 90 tablet 3  . metoprolol tartrate (LOPRESSOR) 25 MG tablet TAKE 1 TABLET (25 MG TOTAL) BY MOUTH 2 (TWO) TIMES DAILY. 180 tablet 3  . nitroGLYCERIN (NITROSTAT) 0.4 MG SL tablet Place 1 tablet (0.4 mg total) under the tongue every 5 (five) minutes as needed for chest pain. 25 tablet 12   No current facility-administered medications for this visit.    Allergies: Allergies  Allergen Reactions  . Zithromax [Azithromycin]     Social History: The patient  reports that she quit smoking about 6 years ago. Her smoking use included Cigarettes. She does not have any smokeless tobacco history on file. She reports that she does not drink alcohol or use illicit drugs.   Family History: The patient's family history includes Diabetes in her father and mother; Hypertension in her mother.   Review of Systems: Please  see the history of present illness.   Otherwise, the review of systems is positive for none.   All other systems are reviewed and negative.   Physical Exam: VS:  BP 140/80 mmHg  Pulse 78  Ht 5\' 2"  (1.575 m)  Wt 240 lb 1.9 oz (108.918 kg)  BMI 43.91 kg/m2 .  BMI Body mass index is 43.91 kg/(m^2).  Wt Readings from Last 3 Encounters:  02/20/16 240 lb 1.9 oz (108.918 kg)  01/16/16 239 lb 1.9 oz (108.464 kg)  09/27/14 233 lb 12.8 oz (106.051 kg)    General: Pleasant. Well developed, well nourished and in no acute distress. Remains obese. HEENT: Normal. Neck: Supple, no JVD, carotid bruits, or masses noted.  Cardiac: Regular rate and rhythm. No murmurs, rubs, or gallops. No edema.  Respiratory:  Lungs are clear to auscultation bilaterally with normal work of breathing.  GI: Soft and nontender.  MS: No  deformity or atrophy. Gait and ROM intact. Skin: Warm and dry. Color is normal.  Neuro:  Strength and sensation are intact and no gross focal deficits noted.  Psych: Alert, appropriate and with normal affect.   LABORATORY DATA:  EKG:  EKG is not ordered today.  Lab Results  Component Value Date   WBC 7.4 01/16/2016   HGB 13.0 01/16/2016   HCT 37.4 01/16/2016   PLT 374 01/16/2016   GLUCOSE 104* 01/16/2016   CHOL 182 01/16/2016   TRIG 233* 01/16/2016   HDL 31* 01/16/2016   LDLDIRECT 126.0 09/27/2014   LDLCALC 104 01/16/2016   ALT 35* 01/16/2016   AST 26 01/16/2016   NA 135 01/16/2016   K 3.8 01/16/2016   CL 100 01/16/2016   CREATININE 1.09 01/16/2016   BUN 18 01/16/2016   CO2 28 01/16/2016   INR 1.21 02/20/2010    BNP (last 3 results) No results for input(s): BNP in the last 8760 hours.  ProBNP (last 3 results) No results for input(s): PROBNP in the last 8760 hours.   Other Studies Reviewed Today:   Assessment/Plan: 1. CAD - past inferior MI treated with BMS to the distal RCA - currently with not symptoms. She is off her Plavix. Just on aspirin. Will continue with her current plan of care.   2. HLD - recent labs noted  3. Anemia - remains on some iron - has declined GYN visits  4. HTN - BP much better with addition of Norvasc.   5. Past tobacco abuse - resolved  6. Obesity - encouraged her to be more active - unfortunately, this has been a long standing issue.   7. Noncompliance - long standing issue. I suspect most of this is money driven.   Current medicines are reviewed with the patient today.  The patient does not have concerns regarding medicines other than what has been noted above.  The following changes have been made:  See above.  Labs/ tests ordered today include:   No orders of the defined types were placed in this encounter.     Disposition:   FU with me in 12  months.   Patient is agreeable to this plan and will call if any problems  develop in the interim.   Signed: Rosalio MacadamiaLori C. Monty Spicher, RN, ANP-C 02/20/2016 10:17 AM  [facility] [address] [place], [place]  [address] Phone: [phone] Fax: [phone]

## 2017-01-08 ENCOUNTER — Other Ambulatory Visit: Payer: Self-pay | Admitting: Nurse Practitioner

## 2017-01-08 DIAGNOSIS — E785 Hyperlipidemia, unspecified: Secondary | ICD-10-CM

## 2017-01-08 DIAGNOSIS — I10 Essential (primary) hypertension: Secondary | ICD-10-CM

## 2017-01-08 DIAGNOSIS — I259 Chronic ischemic heart disease, unspecified: Secondary | ICD-10-CM

## 2017-01-08 DIAGNOSIS — I2510 Atherosclerotic heart disease of native coronary artery without angina pectoris: Secondary | ICD-10-CM

## 2017-02-04 ENCOUNTER — Encounter: Payer: Self-pay | Admitting: *Deleted

## 2017-02-19 ENCOUNTER — Ambulatory Visit: Payer: BLUE CROSS/BLUE SHIELD | Admitting: Nurse Practitioner

## 2017-03-17 ENCOUNTER — Encounter: Payer: Self-pay | Admitting: Nurse Practitioner

## 2017-03-17 ENCOUNTER — Ambulatory Visit (INDEPENDENT_AMBULATORY_CARE_PROVIDER_SITE_OTHER): Payer: BLUE CROSS/BLUE SHIELD | Admitting: Nurse Practitioner

## 2017-03-17 VITALS — BP 158/90 | HR 71 | Ht 62.0 in | Wt 238.4 lb

## 2017-03-17 DIAGNOSIS — E7800 Pure hypercholesterolemia, unspecified: Secondary | ICD-10-CM | POA: Diagnosis not present

## 2017-03-17 DIAGNOSIS — I10 Essential (primary) hypertension: Secondary | ICD-10-CM | POA: Diagnosis not present

## 2017-03-17 DIAGNOSIS — I2510 Atherosclerotic heart disease of native coronary artery without angina pectoris: Secondary | ICD-10-CM

## 2017-03-17 DIAGNOSIS — I259 Chronic ischemic heart disease, unspecified: Secondary | ICD-10-CM | POA: Diagnosis not present

## 2017-03-17 LAB — HEPATIC FUNCTION PANEL
ALT: 17 IU/L (ref 0–32)
AST: 15 IU/L (ref 0–40)
Albumin: 4.2 g/dL (ref 3.5–5.5)
Alkaline Phosphatase: 66 IU/L (ref 39–117)
Bilirubin Total: 0.3 mg/dL (ref 0.0–1.2)
Bilirubin, Direct: 0.08 mg/dL (ref 0.00–0.40)
Total Protein: 7.5 g/dL (ref 6.0–8.5)

## 2017-03-17 LAB — LIPID PANEL
Chol/HDL Ratio: 3.8 ratio (ref 0.0–4.4)
Cholesterol, Total: 121 mg/dL (ref 100–199)
HDL: 32 mg/dL — ABNORMAL LOW (ref >39)
LDL Calculated: 58 mg/dL (ref 0–99)
Triglycerides: 157 mg/dL — ABNORMAL HIGH (ref 0–149)
VLDL Cholesterol Cal: 31 mg/dL (ref 5–40)

## 2017-03-17 LAB — CBC
Hematocrit: 34 % (ref 34.0–46.6)
Hemoglobin: 11.2 g/dL (ref 11.1–15.9)
MCH: 28 pg (ref 26.6–33.0)
MCHC: 32.9 g/dL (ref 31.5–35.7)
MCV: 85 fL (ref 79–97)
Platelets: 404 10*3/uL — ABNORMAL HIGH (ref 150–379)
RBC: 4 x10E6/uL (ref 3.77–5.28)
RDW: 13.3 % (ref 12.3–15.4)
WBC: 7.3 10*3/uL (ref 3.4–10.8)

## 2017-03-17 LAB — BASIC METABOLIC PANEL
BUN/Creatinine Ratio: 19 (ref 9–23)
BUN: 20 mg/dL (ref 6–24)
CO2: 22 mmol/L (ref 20–29)
Calcium: 9.1 mg/dL (ref 8.7–10.2)
Chloride: 102 mmol/L (ref 96–106)
Creatinine, Ser: 1.07 mg/dL — ABNORMAL HIGH (ref 0.57–1.00)
GFR calc Af Amer: 72 mL/min/{1.73_m2} (ref >59)
GFR calc non Af Amer: 63 mL/min/{1.73_m2} (ref >59)
Glucose: 107 mg/dL — ABNORMAL HIGH (ref 65–99)
Potassium: 3.8 mmol/L (ref 3.5–5.2)
Sodium: 139 mmol/L (ref 134–144)

## 2017-03-17 MED ORDER — METOPROLOL TARTRATE 25 MG PO TABS: ORAL_TABLET | ORAL | 3 refills | Status: DC

## 2017-03-17 MED ORDER — ASPIRIN EC 81 MG PO TBEC: 81.0000 mg | DELAYED_RELEASE_TABLET | Freq: Every day | ORAL | Status: AC

## 2017-03-17 MED ORDER — AMLODIPINE BESYLATE 5 MG PO TABS: 5.0000 mg | ORAL_TABLET | Freq: Every day | ORAL | 3 refills | Status: DC

## 2017-03-17 MED ORDER — ATORVASTATIN CALCIUM 20 MG PO TABS: 20.0000 mg | ORAL_TABLET | Freq: Every day | ORAL | 3 refills | Status: DC

## 2017-03-17 MED ORDER — HYDROCHLOROTHIAZIDE 25 MG PO TABS: 25.0000 mg | ORAL_TABLET | Freq: Every day | ORAL | 3 refills | Status: DC

## 2017-03-17 NOTE — Progress Notes (Signed)
CARDIOLOGY OFFICE NOTE  Date:  03/17/2017    Annette Singleton Date of Birth: 1971/07/25 Medical Record #[id number]  PCP:  Elias Else, MD  Cardiologist:  Tyrone Sage   Chief Complaint  Patient presents with  . Hypertension    1 year check.     History of Present Illness: Annette Singleton is a 46 y.o. female who presents today for a one year check. Previousl patient of Dr. Ronnald Nian. Primarily follows with me.   She has known CAD with prior inferior MI treated with BMS to the distal RCA in 2011 Other issues include HLD, tobacco abuse and anemia. Has been asked several times by myself to follow up with GYN for her anemia to no avail. She has had multiple "no show" appointments and is noncompliant. Usually only comes to get her medicines refilled.   Seen over a year ago - was doing ok from our standpoint.   Comes in today. Here with her daughter. Says she is doing ok. No chest pain. Breathing is ok. Very sedentary with her job. Does not exercise. Says her BP is better - checks occasional at [employer] - no higher than the 130's. Concerned about "flushing" - cheeks get red, she gets hot, then it passes. She has not seen GYN in several years. No regular health maintenance. No mammogram.   Past Medical History:  Diagnosis Date  . CAD (coronary artery disease)   . History of anemia   . HTN (hypertension)   . Hyperlipemia   . Inferior MI [facility]) July 2011   with BMS to distal RCA; EF is normal.  . Obese   . Past use of tobacco     Past Surgical History:  Procedure Laterality Date  . CORONARY STENT PLACEMENT  July 2011   distal RCA; EF is normal.      Medications: Current Meds  Medication Sig  . amLODipine (NORVASC) 5 MG tablet Take 1 tablet (5 mg total) by mouth daily.  Marland Kitchen atorvastatin (LIPITOR) 20 MG tablet Take 1 tablet (20 mg total) by mouth daily.  . ferrous sulfate 325 (65 FE) MG tablet Take 325 mg by mouth 2 (two) times daily with a meal.  . hydrochlorothiazide  (HYDRODIURIL) 25 MG tablet Take 1 tablet (25 mg total) by mouth daily.  . metoprolol tartrate (LOPRESSOR) 25 MG tablet TAKE 1 TABLET (25 MG TOTAL) BY MOUTH 2 (TWO) TIMES DAILY.  . nitroGLYCERIN (NITROSTAT) 0.4 MG SL tablet Place 1 tablet (0.4 mg total) under the tongue every 5 (five) minutes as needed for chest pain.  . [DISCONTINUED] amLODipine (NORVASC) 5 MG tablet TAKE 1 TABLET (5 MG TOTAL) BY MOUTH DAILY.  . [DISCONTINUED] aspirin 325 MG tablet Take 325 mg by mouth daily.   . [DISCONTINUED] atorvastatin (LIPITOR) 20 MG tablet Take 1 tablet (20 mg total) by mouth daily.  . [DISCONTINUED] hydrochlorothiazide (HYDRODIURIL) 25 MG tablet TAKE 1 TABLET (25 MG TOTAL) BY MOUTH DAILY.  . [DISCONTINUED] metoprolol tartrate (LOPRESSOR) 25 MG tablet TAKE 1 TABLET (25 MG TOTAL) BY MOUTH 2 (TWO) TIMES DAILY.     Allergies: Allergies  Allergen Reactions  . Zithromax [Azithromycin]     Social History: The patient  reports that she quit smoking about 7 years ago. Her smoking use included Cigarettes. She has never used smokeless tobacco. She reports that she does not drink alcohol or use drugs.   Family History: The patient's family history includes Diabetes in her father and mother; Hypertension in her mother.  Review of Systems: Please see the history of present illness.   Otherwise, the review of systems is positive for none.   All other systems are reviewed and negative.   Physical Exam: VS:  BP (!) 158/90 (BP Location: Left Arm, Patient Position: Sitting, Cuff Size: Large)   Pulse 71   Ht 5\' 2"  (1.575 m)   Wt 238 lb 6.4 oz (108.1 kg)   BMI 43.60 kg/m  .  BMI Body mass index is 43.6 kg/m.  Wt Readings from Last 3 Encounters:  03/17/17 238 lb 6.4 oz (108.1 kg)  02/20/16 240 lb 1.9 oz (108.9 kg)  01/16/16 239 lb 1.9 oz (108.5 kg)   Repeat BP is 140/90 by me.   General: Pleasant. Obese. Alert and in no acute distress.   HEENT: Normal but with missing teeth.  Neck: Supple, no JVD,  carotid bruits, or masses noted.  Cardiac: Regular rate and rhythm. No murmurs, rubs, or gallops. No edema.  Respiratory:  Lungs are clear to auscultation bilaterally with normal work of breathing.  GI: Soft and nontender.  MS: No deformity or atrophy. Gait and ROM intact.  Skin: Warm and dry. Color is normal.  Neuro:  Strength and sensation are intact and no gross focal deficits noted.  Psych: Alert, appropriate and with normal affect.   LABORATORY DATA:  EKG:  EKG is ordered today. This demonstrates NSR with prior inferior infarct.   Lab Results  Component Value Date   WBC 7.4 01/16/2016   HGB 13.0 01/16/2016   HCT 37.4 01/16/2016   PLT 374 01/16/2016   GLUCOSE 104 (H) 01/16/2016   CHOL 182 01/16/2016   TRIG 233 (H) 01/16/2016   HDL 31 (L) 01/16/2016   LDLDIRECT 126.0 09/27/2014   LDLCALC 104 01/16/2016   ALT 35 (H) 01/16/2016   AST 26 01/16/2016   NA 135 01/16/2016   K 3.8 01/16/2016   CL 100 01/16/2016   CREATININE 1.09 01/16/2016   BUN 18 01/16/2016   CO2 28 01/16/2016   INR 1.21 02/20/2010     BNP (last 3 results) No results for input(s): BNP in the last 8760 hours.  ProBNP (last 3 results) No results for input(s): PROBNP in the last 8760 hours.   Other Studies Reviewed Today:   Assessment/Plan:  1. CAD - past inferior MI treated with BMS to the distal RCA - currently without symptoms. No longer on Plavix. Just on aspirin - unclear as to why on high dose - cut back to 81 mg today. Will continue with her current plan of care. CV risk factor modification again recommended.   2. HLD - needs labs today - on statin therapy.   3. Anemia - remains on some iron - has declined GYN visits when asked previously.   4. HTN - BP little better by me. She has had better control at home.   5. Past tobacco abuse - resolved  6. Obesity - encouraged her to be more active - unfortunately, this has been a long standing issue that I do not see changing.   7.  Noncompliance - long standing issue. I have suspected this this is mostly due to money issues.   8. Health maintenance - she does not really seem interested in health maintenance - I have explained how this will affect long term prognosis.   Current medicines are reviewed with the patient today.  The patient does not have concerns regarding medicines other than what has been noted above.  The following  changes have been made:  See above.  Labs/ tests ordered today include:    Orders Placed This Encounter  Procedures  . Basic metabolic panel  . CBC  . Hepatic function panel  . Lipid panel  . EKG 12-Lead     Disposition:   FU with me in one year.   Patient is agreeable to this plan and will call if any problems develop in the interim.   SignedNorma Fredrickson, NP  03/17/2017 8:24 AM  [facility] [address] [place], [place]  [address] Phone: [phone] Fax: [phone]

## 2017-03-17 NOTE — Patient Instructions (Addendum)
We will be checking the following labs today - BMET, CBC, HPF and lipids   Medication Instructions:    Continue with your current medicines. BUT  Cut your dose of aspirin back to just 81 mg a day  Your refills have been sent in today    Testing/Procedures To Be Arranged:  N/A  Follow-Up:   See me in one year with EKG and labs    Other Special Instructions:   Think about what we talked about today.     If you need a refill on your cardiac medications before your next appointment, please call your pharmacy.   Call the [facility] office at [phone] if you have any questions, problems or concerns.

## 2017-04-07 ENCOUNTER — Other Ambulatory Visit: Payer: Self-pay | Admitting: Nurse Practitioner

## 2018-03-16 ENCOUNTER — Ambulatory Visit: Payer: BLUE CROSS/BLUE SHIELD | Admitting: Nurse Practitioner

## 2018-04-09 ENCOUNTER — Other Ambulatory Visit: Payer: Self-pay | Admitting: Nurse Practitioner

## 2018-04-09 DIAGNOSIS — I2510 Atherosclerotic heart disease of native coronary artery without angina pectoris: Secondary | ICD-10-CM

## 2018-04-09 DIAGNOSIS — I259 Chronic ischemic heart disease, unspecified: Secondary | ICD-10-CM

## 2018-04-09 DIAGNOSIS — I10 Essential (primary) hypertension: Secondary | ICD-10-CM

## 2018-04-09 DIAGNOSIS — E7800 Pure hypercholesterolemia, unspecified: Secondary | ICD-10-CM

## 2018-05-04 ENCOUNTER — Ambulatory Visit: Payer: BLUE CROSS/BLUE SHIELD | Admitting: Nurse Practitioner

## 2018-06-08 ENCOUNTER — Ambulatory Visit: Payer: BLUE CROSS/BLUE SHIELD | Admitting: Nurse Practitioner

## 2018-06-30 ENCOUNTER — Ambulatory Visit: Payer: BLUE CROSS/BLUE SHIELD | Admitting: Nurse Practitioner

## 2018-07-28 ENCOUNTER — Ambulatory Visit: Payer: BLUE CROSS/BLUE SHIELD | Admitting: Nurse Practitioner

## 2018-07-28 NOTE — Progress Notes (Deleted)
CARDIOLOGY OFFICE NOTE  Date:  07/28/2018    Annette LombardGlenda B Kantner Date of Birth: [birth year] Medical Record #[id number]  PCP:  Elias Elseeade, Robert, MD  Cardiologist:  Tyrone SageGerhardt & ***    No chief complaint on file.   History of Present Illness: Annette Singleton is a 47 y.o. female who presents today for a ***  Previousl patient of Dr. Ronnald Nianennant's. Primarily follows with me.   She has known CAD with prior inferior MI treated with BMS to the distal RCA in 2011 Other issues include HLD, tobacco abuse and anemia. Has been asked several times by myself to follow up with GYN for her anemia to no avail. She has had multiple "no show" appointments and is noncompliant. Usually only comes to get her medicines refilled.   Seen over a year ago - was doing ok from our standpoint.   Comes in today. Here with her daughter. Says she is doing ok. No chest pain. Breathing is ok. Very sedentary with her job. Does not exercise. Says her BP is better - checks occasional at [employer] - no higher than the 130's. Concerned about "flushing" - cheeks get red, she gets hot, then it passes. She has not seen GYN in several years. No regular health maintenance. No mammogram.   Comes in today. Here with   Past Medical History:  Diagnosis Date  . CAD (coronary artery disease)   . History of anemia   . HTN (hypertension)   . Hyperlipemia   . Inferior MI [facility] July 2011   with BMS to distal RCA; EF is normal.  . Obese   . Past use of tobacco     Past Surgical History:  Procedure Laterality Date  . CORONARY STENT PLACEMENT  July 2011   distal RCA; EF is normal.      Medications: No outpatient medications have been marked as taking for the 07/28/18 encounter (Appointment) with Rosalio MacadamiaGerhardt, Makailee Nudelman C, NP.     Allergies: Allergies  Allergen Reactions  . Zithromax [Azithromycin]     Social History: The patient  reports that she quit smoking about 8 years ago. Her smoking use included cigarettes. She has never  used smokeless tobacco. She reports that she does not drink alcohol or use drugs.   Family History: The patient's ***family history includes Diabetes in her father and mother; Hypertension in her mother.   Review of Systems: Please see the history of present illness.   Otherwise, the review of systems is positive for {NONE DEFAULTED:18576::"none"}.   All other systems are reviewed and negative.   Physical Exam: VS:  There were no vitals taken for this visit. Marland Kitchen.  BMI There is no height or weight on file to calculate BMI.  Wt Readings from Last 3 Encounters:  03/17/17 238 lb 6.4 oz (108.1 kg)  02/20/16 240 lb 1.9 oz (108.9 kg)  01/16/16 239 lb 1.9 oz (108.5 kg)    General: Pleasant. Well developed, well nourished and in no acute distress.   HEENT: Normal.  Neck: Supple, no JVD, carotid bruits, or masses noted.  Cardiac: ***Regular rate and rhythm. No murmurs, rubs, or gallops. No edema.  Respiratory:  Lungs are clear to auscultation bilaterally with normal work of breathing.  GI: Soft and nontender.  MS: No deformity or atrophy. Gait and ROM intact.  Skin: Warm and dry. Color is normal.  Neuro:  Strength and sensation are intact and no gross focal deficits noted.  Psych: Alert, appropriate and with normal affect.  LABORATORY DATA:  EKG:  EKG {ACTION; IS/IS [id number]}OT:[id number]} ordered today. This demonstrates ***.  Lab Results  Component Value Date   WBC 7.3 03/17/2017   HGB 11.2 03/17/2017   HCT 34.0 03/17/2017   PLT 404 (H) 03/17/2017   GLUCOSE 107 (H) 03/17/2017   CHOL 121 03/17/2017   TRIG 157 (H) 03/17/2017   HDL 32 (L) 03/17/2017   LDLDIRECT 126.0 09/27/2014   LDLCALC 58 03/17/2017   ALT 17 03/17/2017   AST 15 03/17/2017   NA 139 03/17/2017   K 3.8 03/17/2017   CL 102 03/17/2017   CREATININE 1.07 (H) 03/17/2017   BUN 20 03/17/2017   CO2 22 03/17/2017   INR 1.21 02/20/2010     BNP (last 3 results) No results for input(s): BNP in the last 8760 hours.  ProBNP  (last 3 results) No results for input(s): PROBNP in the last 8760 hours.   Other Studies Reviewed Today:   Assessment/Plan: . CAD - past inferior MI treated with BMS to the distal RCA - currently without symptoms. No longer on Plavix. Just on aspirin - unclear as to why on high dose - cut back to 81 mg today. Will continue with her current plan of care. CV risk factor modification again recommended.   2. HLD - needs labs today - on statin therapy.   3. Anemia - remains on some iron - has declined GYN visits when asked previously.   4. HTN - BP little better by me. She has had better control at home.   5. Past tobacco abuse - resolved  6. Obesity - encouraged her to be more active - unfortunately, this has been a long standing issue that I do not see changing.   7. Noncompliance - long standing issue. I have suspected this this is mostly due to money issues.   8. Health maintenance - she does not really seem interested in health maintenance - I have explained how this will affect long term prognosis.    Current medicines are reviewed with the patient today.  The patient does not have concerns regarding medicines other than what has been noted above.  The following changes have been made:  See above.  Labs/ tests ordered today include:   No orders of the defined types were placed in this encounter.    Disposition:   FU with *** in {gen number 5-28:413244}0-10:310397} {Days to years:10300}.   Patient is agreeable to this plan and will call if any problems develop in the interim.   SignedNorma Fredrickson: Travis Mastel, NP  07/28/2018 7:30 AM  [facility] [address] [place], [place]  [address] Phone: [phone]) [phone] Fax: [phone]) [phone]

## 2018-07-30 ENCOUNTER — Encounter: Payer: Self-pay | Admitting: Nurse Practitioner

## 2019-04-04 ENCOUNTER — Other Ambulatory Visit: Payer: Self-pay | Admitting: Nurse Practitioner

## 2019-04-04 DIAGNOSIS — I10 Essential (primary) hypertension: Secondary | ICD-10-CM

## 2019-04-04 DIAGNOSIS — E7800 Pure hypercholesterolemia, unspecified: Secondary | ICD-10-CM

## 2019-04-04 DIAGNOSIS — I2510 Atherosclerotic heart disease of native coronary artery without angina pectoris: Secondary | ICD-10-CM

## 2019-04-04 DIAGNOSIS — I259 Chronic ischemic heart disease, unspecified: Secondary | ICD-10-CM

## 2019-04-21 ENCOUNTER — Other Ambulatory Visit: Payer: Self-pay | Admitting: Nurse Practitioner

## 2019-04-22 ENCOUNTER — Telehealth: Payer: Self-pay | Admitting: Nurse Practitioner

## 2019-04-22 NOTE — Progress Notes (Signed)
Virtual Visit via Video Note   This visit type was conducted due to national recommendations for restrictions regarding the COVID-19 Pandemic (e.g. social distancing) in an effort to limit this patient's exposure and mitigate transmission in our community.  Due to her co-morbid illnesses, this patient is at least at moderate risk for complications without adequate follow up.  This format is felt to be most appropriate for this patient at this time.  All issues noted in this document were discussed and addressed.  A limited physical exam was performed with this format.  Please refer to the patient's chart for her consent to telehealth for [facility].   Date:  04/23/2019   ID:  Annette Singleton, DOB 02/24/1971, MRN [id number]  Patient Location: Other:  Work Provider Location: Home  PCP:  Maury Dus, MD  Cardiologist:  Sherren Mocha, MD / Truitt Merle, NP (usually sees Annette Singleton) Electrophysiologist:  None   Evaluation Performed:  Follow-Up Visit  Chief Complaint: CAD  History of Present Illness:    Annette Singleton is a 48 y.o. female with:  Coronary artery disease   S/p inferior MI >> BMS to the distal RCA (PLB) in 2011  EF preserved  Hyperlipidemia  Prior tobacco abuse  Anemia  Work-up with GYN declined in the past  Hypertension  She was last seen by Truitt Merle, NP in 03/2017.  Today, she notes she is doing well.  She has not had chest pain, shortness of breath, syncope, orthopnea.  She does have lower extremity swelling that seems to be worse with prolonged sitting or prolonged standing.  Leg elevation provides relief.  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  The patient does not have symptoms concerning for COVID-19 infection (fever, chills, cough, or new shortness of breath).    Past Medical History:  Diagnosis Date  . CAD (coronary artery disease)   . History of anemia   . HTN (hypertension)   .  Hyperlipemia   . Inferior MI [facility]) July 2011   with BMS to distal RCA; EF is normal.  . Obese   . Past use of tobacco    Past Surgical History:  Procedure Laterality Date  . CORONARY STENT PLACEMENT  July 2011   distal RCA; EF is normal.      Current Meds  Medication Sig  . amLODipine (NORVASC) 5 MG tablet Take 1 tablet (5 mg total) by mouth daily.  Marland Kitchen aspirin EC 81 MG tablet Take 1 tablet (81 mg total) by mouth daily.  Marland Kitchen atorvastatin (LIPITOR) 20 MG tablet Take 1 tablet (20 mg total) by mouth daily.  . ferrous sulfate 325 (65 FE) MG tablet Take 325 mg by mouth 2 (two) times daily with a meal.  . hydrochlorothiazide (HYDRODIURIL) 25 MG tablet Take 1 tablet (25 mg total) by mouth daily.  . metoprolol tartrate (LOPRESSOR) 25 MG tablet Take 1 tablet (25 mg total) by mouth 2 (two) times daily.  . [DISCONTINUED] amLODipine (NORVASC) 5 MG tablet TAKE 1 TABLET BY MOUTH EVERY DAY  . [DISCONTINUED] atorvastatin (LIPITOR) 20 MG tablet Take 1 tablet (20 mg total) by mouth daily.  . [DISCONTINUED] hydrochlorothiazide (HYDRODIURIL) 25 MG tablet TAKE 1 TABLET BY MOUTH EVERY DAY  . [DISCONTINUED] metoprolol tartrate (LOPRESSOR) 25 MG tablet TAKE 1 TABLET BY MOUTH TWICE A DAY     Allergies:   Zithromax [azithromycin]   Social History   Tobacco Use  .  Smoking status: Former Smoker    Types: Cigarettes    Quit date: 02/20/2010    Years since quitting: 9.1  . Smokeless tobacco: Never Used  Substance Use Topics  . Alcohol use: No  . Drug use: No     Family Hx: The patient's family history includes Diabetes in her father and mother; Hypertension in her mother.  ROS:   Please see the history of present illness.    All other systems reviewed and are negative.   Prior CV studies:   The following studies were reviewed today:   Cardiac catheterization 02/20/10 LM normal LAD diffuse irregularities RI diffuse irregularities LCx mid 40-50 RCA okay with large posterior lateral branch 100 EF  55-60 PCI: BMS to the PLB  Labs/Other Tests and Data Reviewed:    EKG:  No ECG reviewed.  Recent Labs: No results found for requested labs within last 8760 hours.   Recent Lipid Panel Lab Results  Component Value Date/Time   CHOL 121 03/17/2017 08:33 AM   TRIG 157 (H) 03/17/2017 08:33 AM   HDL 32 (L) 03/17/2017 08:33 AM   CHOLHDL 3.8 03/17/2017 08:33 AM   CHOLHDL 5.9 (H) 01/16/2016 10:22 AM   LDLCALC 58 03/17/2017 08:33 AM   LDLDIRECT 126.0 09/27/2014 09:02 AM    Wt Readings from Last 3 Encounters:  04/23/19 238 lb (108 kg)  03/17/17 238 lb 6.4 oz (108.1 kg)  02/20/16 240 lb 1.9 oz (108.9 kg)     Objective:    Vital Signs:  Ht 5' 2.5" (1.588 m)   Wt 238 lb (108 kg)   BMI 42.84 kg/m    VITAL SIGNS:  reviewed GEN:  no acute distress EYES:  sclerae anicteric, EOMI - Extraocular Movements Intact RESPIRATORY:  Normal respiratory effort NEURO:  alert and oriented x 3, no obvious focal deficit PSYCH:  normal affect  ASSESSMENT & PLAN:    1. Coronary artery disease involving native coronary artery of native heart without angina pectoris History of inferior myocardial infarction in 2011 treated with a bare-metal stent to the distal RCA.  She is doing well without anginal symptoms.  Continue aspirin, statin therapy.  She will need a follow-up in person in 1 year to obtain an EKG.  2. Essential hypertension She was at work today and could not check her blood pressure.  However, she does check her blood pressure 2-3 times a week.  It typically runs in the 120s over 80s.  Continue current therapy.  3. Other hyperlipidemia Continue statin therapy.  Arrange fasting CMET, lipids.  4. Leg swelling She describes venous insufficiency.  I have asked her to continue to watch her salt.  I have also recommended she obtain compression stockings to help reduce her swelling.  Time:   Today, I have spent 10 minutes with the patient with telehealth technology discussing the above  problems.     Medication Adjustments/Labs and Tests Ordered: Current medicines are reviewed at length with the patient today.  Concerns regarding medicines are outlined above.   Tests Ordered: Orders Placed This Encounter  Procedures  . Comp Met (CMET)  . Lipid panel    Medication Changes: Meds ordered this encounter  Medications  . amLODipine (NORVASC) 5 MG tablet    Sig: Take 1 tablet (5 mg total) by mouth daily.    Dispense:  90 tablet    Refill:  3  . atorvastatin (LIPITOR) 20 MG tablet    Sig: Take 1 tablet (20 mg total) by mouth daily.  Dispense:  90 tablet    Refill:  3  . hydrochlorothiazide (HYDRODIURIL) 25 MG tablet    Sig: Take 1 tablet (25 mg total) by mouth daily.    Dispense:  90 tablet    Refill:  3  . nitroGLYCERIN (NITROSTAT) 0.4 MG SL tablet    Sig: Place 1 tablet (0.4 mg total) under the tongue every 5 (five) minutes as needed for chest pain.    Dispense:  25 tablet    Refill:  12  . metoprolol tartrate (LOPRESSOR) 25 MG tablet    Sig: Take 1 tablet (25 mg total) by mouth 2 (two) times daily.    Dispense:  180 tablet    Refill:  3    Follow Up:  In Person in 1 year(s)  Signed, Richardson Dopp, PA-C  04/23/2019 4:12 PM    [facility]

## 2019-04-22 NOTE — Telephone Encounter (Signed)
° °*  STAT* If patient is at the pharmacy, call can be transferred to refill team.   1. Which medications need to be refilled? (please list name of each medication and dose if known)  metoprolol tartrate (LOPRESSOR) 25 MG tablet amLODipine (NORVASC) 5 MG tablet hydrochlorothiazide (HYDRODIURIL) 25 MG tablet atorvastatin (LIPITOR) 20 MG tablet  2. Which pharmacy/location (including street and city if local pharmacy) is medication to be sent to? [id number] - [place], [place] - [address]  3. Do they need a 30 day or 90 day supply? 90 with refills  Husband called on behalf of patient. She has a virtual visit scheduled for tomorrow with Kathyrn Drown. Patient is out of all of her medications.

## 2019-04-22 NOTE — Telephone Encounter (Signed)
Called pt per Truitt Merle, NP that pt can get her medications refilled at her visit tomorrow. Pt verbalized understanding.

## 2019-04-22 NOTE — Telephone Encounter (Signed)
Pt is calling requesting refills on her medications. Pt has an appt tomorrow 04/23/19 with Stefani Dama, NP. Truitt Merle, NP saw pt last in 2018. Would Truitt Merle, NP like to refill these medications or should pt wait until appt on 04/23/19? Please address

## 2019-04-22 NOTE — Telephone Encounter (Signed)
Would refill at her visit tomorrow.  Annette Singleton

## 2019-04-23 ENCOUNTER — Telehealth (INDEPENDENT_AMBULATORY_CARE_PROVIDER_SITE_OTHER): Payer: BC Managed Care – PPO | Admitting: Physician Assistant

## 2019-04-23 ENCOUNTER — Other Ambulatory Visit: Payer: Self-pay

## 2019-04-23 VITALS — Ht 62.5 in | Wt 238.0 lb

## 2019-04-23 DIAGNOSIS — I10 Essential (primary) hypertension: Secondary | ICD-10-CM

## 2019-04-23 DIAGNOSIS — I2510 Atherosclerotic heart disease of native coronary artery without angina pectoris: Secondary | ICD-10-CM | POA: Diagnosis not present

## 2019-04-23 DIAGNOSIS — I259 Chronic ischemic heart disease, unspecified: Secondary | ICD-10-CM

## 2019-04-23 DIAGNOSIS — Z7189 Other specified counseling: Secondary | ICD-10-CM

## 2019-04-23 DIAGNOSIS — E7800 Pure hypercholesterolemia, unspecified: Secondary | ICD-10-CM

## 2019-04-23 DIAGNOSIS — E7849 Other hyperlipidemia: Secondary | ICD-10-CM

## 2019-04-23 DIAGNOSIS — M7989 Other specified soft tissue disorders: Secondary | ICD-10-CM

## 2019-04-23 MED ORDER — ATORVASTATIN CALCIUM 20 MG PO TABS: 20.0000 mg | ORAL_TABLET | Freq: Every day | ORAL | 3 refills | Status: DC

## 2019-04-23 MED ORDER — AMLODIPINE BESYLATE 5 MG PO TABS: 5.0000 mg | ORAL_TABLET | Freq: Every day | ORAL | 3 refills | Status: DC

## 2019-04-23 MED ORDER — NITROGLYCERIN 0.4 MG SL SUBL: 0.4000 mg | SUBLINGUAL_TABLET | SUBLINGUAL | 12 refills | Status: DC | PRN

## 2019-04-23 MED ORDER — METOPROLOL TARTRATE 25 MG PO TABS: 25.0000 mg | ORAL_TABLET | Freq: Two times a day (BID) | ORAL | 3 refills | Status: DC

## 2019-04-23 MED ORDER — HYDROCHLOROTHIAZIDE 25 MG PO TABS: 25.0000 mg | ORAL_TABLET | Freq: Every day | ORAL | 3 refills | Status: DC

## 2019-04-23 NOTE — Patient Instructions (Signed)
Medication Instructions:  Your physician recommends that you continue on your current medications as directed. Please refer to the Current Medication list given to you today.  We [facility] in ref[facility] of [facility]   Lab work: 05/14/2019 at 8:45 please come to the office for FASTING Lipid/CMET.  Nothing to eat or drink after midnight the night before except a sip of water with your morning medications.   If you have labs (blood work) drawn today and your tests are completely normal, you will receive your results only by:  MyChart Message (if you have MyChart) OR  A paper copy in the mail If you have any lab test that is abnormal or we need to change your treatment, we will call you to review the results.  Testing/Procedures: None ordered  Follow-Up: At [facility], you and your health needs are our priority.  As part of our continuing mission to provide you with exceptional heart care, we have created designated Provider Care Teams.  These Care Teams include your primary Cardiologist (physician) and Advanced Practice Providers (APPs -  Physician Assistants and Nurse Practitioners) who all work together to provide you with the care you need, when you need it. You will need a follow up appointment in 12 months.  Please call our office 2 months in advance to schedule this appointment.  You may seeLori Tyrone Sage, NP.   Any Other Special Instructions Will Be Listed Below (If Applicable). Maintain low-sodium diet :  See information below:  I told her to try compression stockings to help with some of her leg swelling. She can get these over-the-counter at [facility], [facility], [employer], a running store or in [place] at [employer] [address] [phone]   DASH Eating Plan DASH stands for "Dietary Approaches to Stop Hypertension." The DASH eating plan is a healthy eating plan that has been shown to reduce high blood pressure (hypertension). It may also  reduce your risk for type 2 diabetes, heart disease, and stroke. The DASH eating plan may also help with weight loss. What are tips for following this plan?  General guidelines Avoid eating more than 2,300 mg (milligrams) of salt (sodium) a day. If you have hypertension, you may need to reduce your sodium intake to 1,500 mg a day. Limit alcohol intake to no more than 1 drink a day for nonpregnant women and 2 drinks a day for men. One drink equals 12 oz of beer, 5 oz of wine, or 1 oz of hard liquor. Work with your health care provider to maintain a healthy body weight or to lose weight. Ask what an ideal weight is for you. Get at least 30 minutes of exercise that causes your heart to beat faster (aerobic exercise) most days of the week. Activities may include walking, swimming, or biking. Work with your health care provider or diet and nutrition specialist (dietitian) to adjust your eating plan to your individual calorie needs. Reading food labels  Check food labels for the amount of sodium per serving. Choose foods with less than 5 percent of the Daily Value of sodium. Generally, foods with less than 300 mg of sodium per serving fit into this eating plan. To find whole grains, look for the word "whole" as the first word in the ingredient list. Shopping Buy products labeled as "low-sodium" or "no salt added." Buy fresh foods. Avoid canned foods and premade or frozen meals. Cooking Avoid adding salt when cooking. Use salt-free  seasonings or herbs instead of table salt or sea salt. Check with your health care provider or pharmacist before using salt substitutes. Do not fry foods. Cook foods using healthy methods such as baking, boiling, grilling, and broiling instead. Cook with heart-healthy oils, such as olive, canola, soybean, or sunflower oil. Meal planning Eat a balanced diet that includes: 5 or more servings of fruits and vegetables each day. At each meal, try to fill half of your plate with  fruits and vegetables. Up to 6-8 servings of whole grains each day. Less than 6 oz of lean meat, poultry, or fish each day. A 3-oz serving of meat is about the same size as a deck of cards. One egg equals 1 oz. 2 servings of low-fat dairy each day. A serving of nuts, seeds, or beans 5 times each week. Heart-healthy fats. Healthy fats called Omega-3 fatty acids are found in foods such as flaxseeds and coldwater fish, like sardines, salmon, and mackerel. Limit how much you eat of the following: Canned or prepackaged foods. Food that is high in trans fat, such as fried foods. Food that is high in saturated fat, such as fatty meat. Sweets, desserts, sugary drinks, and other foods with added sugar. Full-fat dairy products. Do not salt foods before eating. Try to eat at least 2 vegetarian meals each week. Eat more home-cooked food and less restaurant, buffet, and fast food. When eating at a restaurant, ask that your food be prepared with less salt or no salt, if possible. What foods are recommended? The items listed may not be a complete list. Talk with your dietitian about what dietary choices are best for you. Grains Whole-grain or whole-wheat bread. Whole-grain or whole-wheat pasta. Brown rice. Orpah Cobbatmeal. Quinoa. Bulgur. Whole-grain and low-sodium cereals. Pita bread. Low-fat, low-sodium crackers. Whole-wheat flour tortillas. Vegetables Fresh or frozen vegetables (raw, steamed, roasted, or grilled). Low-sodium or reduced-sodium tomato and vegetable juice. Low-sodium or reduced-sodium tomato sauce and tomato paste. Low-sodium or reduced-sodium canned vegetables. Fruits All fresh, dried, or frozen fruit. Canned fruit in natural juice (without added sugar). Meat and other protein foods Skinless chicken or [place]. Ground chicken or [place]. Pork with fat trimmed off. Fish and seafood. Egg whites. Dried beans, peas, or lentils. Unsalted nuts, nut butters, and seeds. Unsalted canned beans. Lean cuts of  beef with fat trimmed off. Low-sodium, lean deli meat. Dairy Low-fat (1%) or fat-free (skim) milk. Fat-free, low-fat, or reduced-fat cheeses. Nonfat, low-sodium ricotta or cottage cheese. Low-fat or nonfat yogurt. Low-fat, low-sodium cheese. Fats and oils Soft margarine without trans fats. Vegetable oil. Low-fat, reduced-fat, or light mayonnaise and salad dressings (reduced-sodium). Canola, safflower, olive, soybean, and sunflower oils. Avocado. Seasoning and other foods Herbs. Spices. Seasoning mixes without salt. Unsalted popcorn and pretzels. Fat-free sweets. What foods are not recommended? The items listed may not be a complete list. Talk with your dietitian about what dietary choices are best for you. Grains Baked goods made with fat, such as croissants, muffins, or some breads. Dry pasta or rice meal packs. Vegetables Creamed or fried vegetables. Vegetables in a cheese sauce. Regular canned vegetables (not low-sodium or reduced-sodium). Regular canned tomato sauce and paste (not low-sodium or reduced-sodium). Regular tomato and vegetable juice (not low-sodium or reduced-sodium). Rosita FirePickles. Olives. Fruits Canned fruit in a light or heavy syrup. Fried fruit. Fruit in cream or butter sauce. Meat and other protein foods Fatty cuts of meat. Ribs. Fried meat. Tomasa BlaseBacon. Sausage. Bologna and other processed lunch meats. Salami. Fatback. Hotdogs. Bratwurst. Salted nuts and  seeds. Canned beans with added salt. Canned or smoked fish. Whole eggs or egg yolks. Chicken or [place] with skin. Dairy Whole or 2% milk, cream, and half-and-half. Whole or full-fat cream cheese. Whole-fat or sweetened yogurt. Full-fat cheese. Nondairy creamers. Whipped toppings. Processed cheese and cheese spreads. Fats and oils Butter. Stick margarine. Lard. Shortening. Ghee. Bacon fat. Tropical oils, such as coconut, palm kernel, or palm oil. Seasoning and other foods Salted popcorn and pretzels. Onion salt, garlic salt, seasoned  salt, table salt, and sea salt. Worcestershire sauce. Tartar sauce. Barbecue sauce. Teriyaki sauce. Soy sauce, including reduced-sodium. Steak sauce. Canned and packaged gravies. Fish sauce. Oyster sauce. Cocktail sauce. Horseradish that you find on the shelf. Ketchup. Mustard. Meat flavorings and tenderizers. Bouillon cubes. Hot sauce and Tabasco sauce. Premade or packaged marinades. Premade or packaged taco seasonings. Relishes. Regular salad dressings. Where to find more information: National Heart, Lung, and [place]: https://wilson-eaton.com/ American Heart Association: www.heart.org Summary The DASH eating plan is a healthy eating plan that has been shown to reduce high blood pressure (hypertension). It may also reduce your risk for type 2 diabetes, heart disease, and stroke. With the DASH eating plan, you should limit salt (sodium) intake to 2,300 mg a day. If you have hypertension, you may need to reduce your sodium intake to 1,500 mg a day. When on the DASH eating plan, aim to eat more fresh fruits and vegetables, whole grains, lean proteins, low-fat dairy, and heart-healthy fats. Work with your health care provider or diet and nutrition specialist (dietitian) to adjust your eating plan to your individual calorie needs. This information is not intended to replace advice given to you by your health care provider. Make sure you discuss any questions you have with your health care provider. Document Released: 07/11/2011 Document Revised: 07/04/2017 Document Reviewed: 07/15/2016 Elsevier Patient Education  2020 [employer].

## 2019-05-14 ENCOUNTER — Other Ambulatory Visit: Payer: BC Managed Care – PPO

## 2020-04-21 ENCOUNTER — Other Ambulatory Visit: Payer: Self-pay | Admitting: Physician Assistant

## 2020-04-21 DIAGNOSIS — I10 Essential (primary) hypertension: Secondary | ICD-10-CM

## 2020-04-21 DIAGNOSIS — I2510 Atherosclerotic heart disease of native coronary artery without angina pectoris: Secondary | ICD-10-CM

## 2020-04-21 DIAGNOSIS — I259 Chronic ischemic heart disease, unspecified: Secondary | ICD-10-CM

## 2020-04-21 DIAGNOSIS — E7800 Pure hypercholesterolemia, unspecified: Secondary | ICD-10-CM

## 2020-04-22 ENCOUNTER — Other Ambulatory Visit: Payer: Self-pay | Admitting: Physician Assistant

## 2020-05-19 ENCOUNTER — Other Ambulatory Visit: Payer: Self-pay | Admitting: Nurse Practitioner

## 2020-05-19 ENCOUNTER — Other Ambulatory Visit: Payer: Self-pay | Admitting: Physician Assistant

## 2020-05-19 DIAGNOSIS — I259 Chronic ischemic heart disease, unspecified: Secondary | ICD-10-CM

## 2020-05-19 DIAGNOSIS — I2510 Atherosclerotic heart disease of native coronary artery without angina pectoris: Secondary | ICD-10-CM

## 2020-05-19 DIAGNOSIS — I10 Essential (primary) hypertension: Secondary | ICD-10-CM

## 2020-05-19 DIAGNOSIS — E7800 Pure hypercholesterolemia, unspecified: Secondary | ICD-10-CM

## 2020-05-29 ENCOUNTER — Other Ambulatory Visit: Payer: Self-pay | Admitting: Physician Assistant

## 2020-05-29 DIAGNOSIS — I10 Essential (primary) hypertension: Secondary | ICD-10-CM

## 2020-05-29 DIAGNOSIS — I2510 Atherosclerotic heart disease of native coronary artery without angina pectoris: Secondary | ICD-10-CM

## 2020-05-29 DIAGNOSIS — E7800 Pure hypercholesterolemia, unspecified: Secondary | ICD-10-CM

## 2020-05-29 DIAGNOSIS — I259 Chronic ischemic heart disease, unspecified: Secondary | ICD-10-CM

## 2020-06-12 ENCOUNTER — Telehealth: Payer: Self-pay | Admitting: *Deleted

## 2020-06-12 ENCOUNTER — Other Ambulatory Visit: Payer: Self-pay

## 2020-06-12 ENCOUNTER — Other Ambulatory Visit: Payer: Self-pay | Admitting: Cardiovascular Disease

## 2020-06-12 DIAGNOSIS — I259 Chronic ischemic heart disease, unspecified: Secondary | ICD-10-CM

## 2020-06-12 DIAGNOSIS — I10 Essential (primary) hypertension: Secondary | ICD-10-CM

## 2020-06-12 DIAGNOSIS — E7800 Pure hypercholesterolemia, unspecified: Secondary | ICD-10-CM

## 2020-06-12 DIAGNOSIS — I2510 Atherosclerotic heart disease of native coronary artery without angina pectoris: Secondary | ICD-10-CM

## 2020-06-12 MED ORDER — METOPROLOL TARTRATE 25 MG PO TABS: 25.0000 mg | ORAL_TABLET | Freq: Two times a day (BID) | ORAL | 0 refills | Status: DC

## 2020-06-12 MED ORDER — AMLODIPINE BESYLATE 5 MG PO TABS: 5.0000 mg | ORAL_TABLET | Freq: Every day | ORAL | 0 refills | Status: DC

## 2020-06-12 NOTE — Telephone Encounter (Signed)
  Patient Consent for Virtual Visit         Annette Singleton has provided verbal consent on 06/12/2020 for a virtual visit (video or telephone).   CONSENT FOR VIRTUAL VISIT FOR:  Annette Singleton  By participating in this virtual visit I agree to the following:  I hereby voluntarily request, consent and authorize [facility] and its employed or contracted physicians, physician assistants, nurse practitioners or other licensed health care professionals (the Practitioner), to provide me with telemedicine health care services (the "Services") as deemed necessary by the treating Practitioner. I acknowledge and consent to receive the Services by the Practitioner via telemedicine. I understand that the telemedicine visit will involve communicating with the Practitioner through live audiovisual communication technology and the disclosure of certain medical information by electronic transmission. I acknowledge that I have been given the opportunity to request an in-person assessment or other available alternative prior to the telemedicine visit and am voluntarily participating in the telemedicine visit.  I understand that I have the right to withhold or withdraw my consent to the use of telemedicine in the course of my care at any time, without affecting my right to future care or treatment, and that the Practitioner or I may terminate the telemedicine visit at any time. I understand that I have the right to inspect all information obtained and/or recorded in the course of the telemedicine visit and may receive copies of available information for a reasonable fee.  I understand that some of the potential risks of receiving the Services via telemedicine include:  Marland Kitchen Delay or interruption in medical evaluation due to technological equipment failure or disruption; . Information transmitted may not be sufficient (e.g. poor resolution of images) to allow for appropriate medical decision making by the Practitioner;  and/or  . In rare instances, security protocols could fail, causing a breach of personal health information.  Furthermore, I acknowledge that it is my responsibility to provide information about my medical history, conditions and care that is complete and accurate to the best of my ability. I acknowledge that Practitioner's advice, recommendations, and/or decision may be based on factors not within their control, such as incomplete or inaccurate data provided by me or distortions of diagnostic images or specimens that may result from electronic transmissions. I understand that the practice of medicine is not an exact science and that Practitioner makes no warranties or guarantees regarding treatment outcomes. I acknowledge that a copy of this consent can be made available to me via my patient portal [facility] MyChart), or I can request a printed copy by calling the office of [facility].    I understand that my insurance will be billed for this visit.   I have read or had this consent read to me. . I understand the contents of this consent, which adequately explains the benefits and risks of the Services being provided via telemedicine.  . I have been provided ample opportunity to ask questions regarding this consent and the Services and have had my questions answered to my satisfaction. . I give my informed consent for the services to be provided through the use of telemedicine in my medical care

## 2020-06-12 NOTE — Progress Notes (Signed)
Telehealth Visit     Virtual Visit via Telephone Note   This visit type was conducted due to national recommendations for restrictions regarding the COVID-19 Pandemic (e.g. social distancing) in an effort to limit this patient's exposure and mitigate transmission in our community.  Due to her co-morbid illnesses, this patient is at least at moderate risk for complications without adequate follow up.  This format is felt to be most appropriate for this patient at this time.  The patient did not have access to video technology/had technical difficulties with video requiring transitioning to audio format only (telephone).  All issues noted in this document were discussed and addressed.  No physical exam could be performed with this format.  Please refer to the patient's chart for her  consent to telehealth for [facility].   Evaluation Performed:  Follow-up visit   The patient was identified using 2 identifiers.   This visit type was conducted due to national recommendations for restrictions regarding the COVID-19 Pandemic (e.g. social distancing).  This format is felt to be most appropriate for this patient at this time.  All issues noted in this document were discussed and addressed.  No physical exam was performed (except for noted visual exam findings with Video Visits).  Please refer to the patient's chart (MyChart message for video visits and phone note for telephone visits) for the patient's consent to telehealth for [facility].  Date:  06/13/2020   ID:  Annette Singleton, DOB 1970-10-16, MRN [id number]  Patient Location:  Work  Provider location:   Home  PCP:  Elias Else, MD  Cardiologist:  Tyrone Sage Tonny Bollman, MD  Electrophysiologist:  None   Chief Complaint:  Follow Up  History of Present Illness:    Annette Singleton is a 49 y.o. female who presents via audio/video conferencing for a telehealth visit today.  Seen for Dr. Excell Seltzer. Previous patient of Dr. Ronnald Nian.    Last seen by me in 2018.   She has known CAD with prior inferior MI treated with BMS to the distal RCA in 2011 Other issues include HLD, tobacco abuse and anemia. Has been asked several times to follow up with GYN for her anemia to no avail. She has had multiple "no show" appointments and is noncompliant. Usually only comes to get her medicines refilled. Typically no regular health maintenance.   Had telehealth visit with Lorin Picket a year ago.   The patient does not have symptoms concerning for COVID-19 infection (fever, chills, cough, or new shortness of breath).   Seen today by telephone visit. She has consented for this visit. She has not had labs in almost 3 years. She is requesting refills on her medicines. Not smoking. No blood pressure checked today but she notes that she will check occasionally. It was 142/85 last week after playing with a grandchild outside. At rest it will go lower to 122/83. She is doing well. No chest pain. Not short of breath. She is almost out of her medicines. She has no other concerns and feels like she is doing well.   Past Medical History:  Diagnosis Date  . CAD (coronary artery disease)   . History of anemia   . HTN (hypertension)   . Hyperlipemia   . Inferior MI [facility]) July 2011   with BMS to distal RCA; EF is normal.  . Obese   . Past use of tobacco    Past Surgical History:  Procedure Laterality Date  . CORONARY STENT PLACEMENT  July 2011   distal RCA; EF is normal.      Current Meds  Medication Sig  . amLODipine (NORVASC) 5 MG tablet Take 1 tablet (5 mg total) by mouth daily. Pt must keep upcoming appt in November before anymore refills. Thank you Final Attempt  . aspirin EC 81 MG tablet Take 1 tablet (81 mg total) by mouth daily.  Marland Kitchen atorvastatin (LIPITOR) 20 MG tablet TAKE 1 TABLET BY MOUTH EVERY DAY  . ferrous sulfate 325 (65 FE) MG tablet Take 325 mg by mouth 2 (two) times daily with a meal.  . fluticasone (FLONASE) 50 MCG/ACT nasal spray  Place 1 spray into both nostrils daily.  . hydrochlorothiazide (HYDRODIURIL) 25 MG tablet TAKE 1 TABLET BY MOUTH EVERY DAY  . metoprolol tartrate (LOPRESSOR) 25 MG tablet Take 1 tablet (25 mg total) by mouth 2 (two) times daily. Please keep upcoming appt in November before anymore refills. Thank you Final Attempt  . nitroGLYCERIN (NITROSTAT) 0.4 MG SL tablet Place 1 tablet (0.4 mg total) under the tongue every 5 (five) minutes as needed for chest pain.     Allergies:   Zithromax [azithromycin]   Social History   Tobacco Use  . Smoking status: Former Smoker    Types: Cigarettes    Quit date: 02/20/2010    Years since quitting: 10.3  . Smokeless tobacco: Never Used  Substance Use Topics  . Alcohol use: No  . Drug use: No     Family Hx: The patient's family history includes Diabetes in her father and mother; Hypertension in her mother.  ROS:   Please see the history of present illness.   All other systems reviewed are negative.    Objective:    Vital Signs:  Ht 5\' 2"  (1.575 m)   Wt 238 lb (108 kg)   BMI 43.53 kg/m    Wt Readings from Last 3 Encounters:  06/13/20 238 lb (108 kg)  04/23/19 238 lb (108 kg)  03/17/17 238 lb 6.4 oz (108.1 kg)    Alert female in no acute distress. She sounds good and appropriate. Not short of breath with conversation.    Labs/Other Tests and Data Reviewed:    Lab Results  Component Value Date   WBC 7.3 03/17/2017   HGB 11.2 03/17/2017   HCT 34.0 03/17/2017   PLT 404 (H) 03/17/2017   GLUCOSE 107 (H) 03/17/2017   CHOL 121 03/17/2017   TRIG 157 (H) 03/17/2017   HDL 32 (L) 03/17/2017   LDLDIRECT 126.0 09/27/2014   LDLCALC 58 03/17/2017   ALT 17 03/17/2017   AST 15 03/17/2017   NA 139 03/17/2017   K 3.8 03/17/2017   CL 102 03/17/2017   CREATININE 1.07 (H) 03/17/2017   BUN 20 03/17/2017   CO2 22 03/17/2017   INR 1.21 02/20/2010     BNP (last 3 results) No results for input(s): BNP in the last 8760 hours.  ProBNP (last 3  results) No results for input(s): PROBNP in the last 8760 hours.    Prior CV studies:    The following studies were reviewed today:  Cardiac catheterization 02/20/10 LM normal LAD diffuse irregularities RI diffuse irregularities LCx mid 40-50 RCA okay with large posterior lateral branch 100 EF 55-60 PCI: BMS to the PLB   ASSESSMENT & PLAN:    1. CAD - remote inferior MI from 2011 treated with BMS to distal RCA - she is on aspirin and statin - needs labs. No worrisome symptoms.   2.  HTN - sounds like she has overall ok readings at home. For now, no change in her regimen.   3. HLD - on statin - needs labs - will send in refills for her medicines after her labs are drawn. She has had tendency to not follow up.       Patient Risk:   After full review of this patient's clinical status, I feel that they are at least moderate risk at this time.  Time:   Today, I have spent 5 minutes with the patient with telehealth technology discussing the above issues.     Medication Adjustments/Labs and Tests Ordered: Current medicines are reviewed at length with the patient today.  Concerns regarding medicines are outlined above.   Tests Ordered: No orders of the defined types were placed in this encounter.   Medication Changes: No orders of the defined types were placed in this encounter.   Disposition:  FU with Tereso Newcomer, PA for Dr. Excell Seltzer in 6 months. She is aware that I am leaving the practice in February. Fasting labs arranged for tomorrow AM - will then send in her refills.     Patient is agreeable to this plan and will call if any problems develop in the interim.   Avelina Laine, NP  06/13/2020 8:41 AM    [facility]

## 2020-06-12 NOTE — Telephone Encounter (Signed)
Pt's medication was sent to pt's pharmacy as requested. Confirmation received.  °

## 2020-06-13 ENCOUNTER — Other Ambulatory Visit: Payer: Self-pay

## 2020-06-13 ENCOUNTER — Encounter: Payer: Self-pay | Admitting: Nurse Practitioner

## 2020-06-13 ENCOUNTER — Telehealth (INDEPENDENT_AMBULATORY_CARE_PROVIDER_SITE_OTHER): Payer: BC Managed Care – PPO | Admitting: Nurse Practitioner

## 2020-06-13 VITALS — Ht 62.0 in | Wt 238.0 lb

## 2020-06-13 DIAGNOSIS — I10 Essential (primary) hypertension: Secondary | ICD-10-CM

## 2020-06-13 DIAGNOSIS — I2510 Atherosclerotic heart disease of native coronary artery without angina pectoris: Secondary | ICD-10-CM | POA: Diagnosis not present

## 2020-06-13 DIAGNOSIS — E7849 Other hyperlipidemia: Secondary | ICD-10-CM

## 2020-06-13 DIAGNOSIS — I259 Chronic ischemic heart disease, unspecified: Secondary | ICD-10-CM

## 2020-06-13 DIAGNOSIS — E7800 Pure hypercholesterolemia, unspecified: Secondary | ICD-10-CM

## 2020-06-13 NOTE — Patient Instructions (Addendum)
After Visit Summary:  We will be checking the following labs today - NONE  Need fasting labs tomorrow - BMET, CBC, HPF and Lipids   Medication Instructions:    Continue with your current medicines.   We will send in your refills after your labs are drawn.    If you need a refill on your cardiac medications before your next appointment, please call your pharmacy.     Testing/Procedures To Be Arranged:  N/A  Follow-Up:   See Tereso Newcomer, PA with Dr. Excell Seltzer in 6 months - You will receive a reminder letter in the mail two months in advance. If you don't receive a letter, please call our office to schedule the follow-up appointment.     At [facility], you and your health needs are our priority.  As part of our continuing mission to provide you with exceptional heart care, we have created designated Provider Care Teams.  These Care Teams include your primary Cardiologist (physician) and Advanced Practice Providers (APPs -  Physician Assistants and Nurse Practitioners) who all work together to provide you with the care you need, when you need it.  Special Instructions:  . Stay safe, wash your hands for at least 20 seconds and wear a mask when needed.  . It was good to talk with you today.    Call the [facility] office at [phone] if you have any questions, problems or concerns.

## 2020-06-14 ENCOUNTER — Other Ambulatory Visit: Payer: Self-pay

## 2020-06-14 ENCOUNTER — Other Ambulatory Visit: Payer: BC Managed Care – PPO

## 2020-06-14 DIAGNOSIS — I10 Essential (primary) hypertension: Secondary | ICD-10-CM | POA: Diagnosis not present

## 2020-06-14 DIAGNOSIS — E7800 Pure hypercholesterolemia, unspecified: Secondary | ICD-10-CM

## 2020-06-14 DIAGNOSIS — I2510 Atherosclerotic heart disease of native coronary artery without angina pectoris: Secondary | ICD-10-CM | POA: Diagnosis not present

## 2020-06-14 DIAGNOSIS — E7849 Other hyperlipidemia: Secondary | ICD-10-CM

## 2020-06-14 DIAGNOSIS — I259 Chronic ischemic heart disease, unspecified: Secondary | ICD-10-CM | POA: Diagnosis not present

## 2020-06-14 LAB — CBC
Hematocrit: 40.3 % (ref 34.0–46.6)
Hemoglobin: 13.1 g/dL (ref 11.1–15.9)
MCH: 26.7 pg (ref 26.6–33.0)
MCHC: 32.5 g/dL (ref 31.5–35.7)
MCV: 82 fL (ref 79–97)
Platelets: 361 10*3/uL (ref 150–450)
RBC: 4.91 x10E6/uL (ref 3.77–5.28)
RDW: 12.4 % (ref 11.7–15.4)
WBC: 7.9 10*3/uL (ref 3.4–10.8)

## 2020-06-14 LAB — BASIC METABOLIC PANEL
BUN/Creatinine Ratio: 18 (ref 9–23)
BUN: 17 mg/dL (ref 6–24)
CO2: 27 mmol/L (ref 20–29)
Calcium: 9.7 mg/dL (ref 8.7–10.2)
Chloride: 99 mmol/L (ref 96–106)
Creatinine, Ser: 0.95 mg/dL (ref 0.57–1.00)
GFR calc Af Amer: 82 mL/min/{1.73_m2} (ref >59)
GFR calc non Af Amer: 71 mL/min/{1.73_m2} (ref >59)
Glucose: 110 mg/dL — ABNORMAL HIGH (ref 65–99)
Potassium: 4.1 mmol/L (ref 3.5–5.2)
Sodium: 137 mmol/L (ref 134–144)

## 2020-06-14 LAB — HEPATIC FUNCTION PANEL
ALT: 22 IU/L (ref 0–32)
AST: 19 IU/L (ref 0–40)
Albumin: 4.4 g/dL (ref 3.8–4.8)
Alkaline Phosphatase: 74 IU/L (ref 44–121)
Bilirubin Total: 0.3 mg/dL (ref 0.0–1.2)
Bilirubin, Direct: <0.1 mg/dL (ref 0.00–0.40)
Total Protein: 7.8 g/dL (ref 6.0–8.5)

## 2020-06-14 LAB — LIPID PANEL
Chol/HDL Ratio: 4.6 ratio — ABNORMAL HIGH (ref 0.0–4.4)
Cholesterol, Total: 174 mg/dL (ref 100–199)
HDL: 38 mg/dL — ABNORMAL LOW (ref >39)
LDL Chol Calc (NIH): 106 mg/dL — ABNORMAL HIGH (ref 0–99)
Triglycerides: 172 mg/dL — ABNORMAL HIGH (ref 0–149)
VLDL Cholesterol Cal: 30 mg/dL (ref 5–40)

## 2020-06-14 MED ORDER — ATORVASTATIN CALCIUM 20 MG PO TABS
20.0000 mg | ORAL_TABLET | Freq: Every day | ORAL | 3 refills | Status: DC
Start: 2020-06-14 — End: 2021-06-06

## 2020-06-14 MED ORDER — HYDROCHLOROTHIAZIDE 25 MG PO TABS
25.0000 mg | ORAL_TABLET | Freq: Every day | ORAL | 3 refills | Status: DC
Start: 2020-06-14 — End: 2021-06-06

## 2020-06-15 ENCOUNTER — Other Ambulatory Visit: Payer: Self-pay | Admitting: *Deleted

## 2020-06-15 DIAGNOSIS — E7849 Other hyperlipidemia: Secondary | ICD-10-CM

## 2020-06-15 DIAGNOSIS — E7800 Pure hypercholesterolemia, unspecified: Secondary | ICD-10-CM

## 2020-06-15 MED ORDER — EZETIMIBE 10 MG PO TABS: 10.0000 mg | ORAL_TABLET | Freq: Every day | ORAL | 3 refills | Status: DC

## 2020-07-21 ENCOUNTER — Other Ambulatory Visit: Payer: BC Managed Care – PPO

## 2021-06-06 ENCOUNTER — Other Ambulatory Visit: Payer: Self-pay | Admitting: Cardiovascular Disease

## 2021-06-06 ENCOUNTER — Other Ambulatory Visit: Payer: Self-pay | Admitting: Physician Assistant

## 2021-06-06 DIAGNOSIS — I10 Essential (primary) hypertension: Secondary | ICD-10-CM

## 2021-06-06 DIAGNOSIS — E7800 Pure hypercholesterolemia, unspecified: Secondary | ICD-10-CM

## 2021-06-06 DIAGNOSIS — I259 Chronic ischemic heart disease, unspecified: Secondary | ICD-10-CM

## 2021-06-06 DIAGNOSIS — I2510 Atherosclerotic heart disease of native coronary artery without angina pectoris: Secondary | ICD-10-CM

## 2021-09-04 ENCOUNTER — Other Ambulatory Visit: Payer: Self-pay | Admitting: Cardiovascular Disease

## 2021-09-04 DIAGNOSIS — I259 Chronic ischemic heart disease, unspecified: Secondary | ICD-10-CM

## 2021-09-04 DIAGNOSIS — I10 Essential (primary) hypertension: Secondary | ICD-10-CM

## 2021-09-04 DIAGNOSIS — E7800 Pure hypercholesterolemia, unspecified: Secondary | ICD-10-CM

## 2021-09-04 DIAGNOSIS — I2510 Atherosclerotic heart disease of native coronary artery without angina pectoris: Secondary | ICD-10-CM

## 2021-09-11 ENCOUNTER — Other Ambulatory Visit: Payer: Self-pay | Admitting: Cardiovascular Disease

## 2021-09-11 DIAGNOSIS — I259 Chronic ischemic heart disease, unspecified: Secondary | ICD-10-CM

## 2021-09-11 DIAGNOSIS — I2510 Atherosclerotic heart disease of native coronary artery without angina pectoris: Secondary | ICD-10-CM

## 2021-09-11 DIAGNOSIS — E7800 Pure hypercholesterolemia, unspecified: Secondary | ICD-10-CM

## 2021-09-11 DIAGNOSIS — I10 Essential (primary) hypertension: Secondary | ICD-10-CM

## 2021-09-18 ENCOUNTER — Telehealth: Payer: Self-pay | Admitting: Cardiovascular Disease

## 2021-09-18 DIAGNOSIS — I259 Chronic ischemic heart disease, unspecified: Secondary | ICD-10-CM

## 2021-09-18 DIAGNOSIS — I10 Essential (primary) hypertension: Secondary | ICD-10-CM

## 2021-09-18 DIAGNOSIS — I2510 Atherosclerotic heart disease of native coronary artery without angina pectoris: Secondary | ICD-10-CM

## 2021-09-18 DIAGNOSIS — E7800 Pure hypercholesterolemia, unspecified: Secondary | ICD-10-CM

## 2021-09-18 MED ORDER — METOPROLOL TARTRATE 25 MG PO TABS: 25.0000 mg | ORAL_TABLET | Freq: Two times a day (BID) | ORAL | 0 refills | Status: DC

## 2021-09-18 MED ORDER — ATORVASTATIN CALCIUM 20 MG PO TABS: 20.0000 mg | ORAL_TABLET | Freq: Every day | ORAL | 0 refills | Status: DC

## 2021-09-18 MED ORDER — HYDROCHLOROTHIAZIDE 25 MG PO TABS: 25.0000 mg | ORAL_TABLET | Freq: Every day | ORAL | 0 refills | Status: DC

## 2021-09-18 MED ORDER — AMLODIPINE BESYLATE 5 MG PO TABS: 5.0000 mg | ORAL_TABLET | Freq: Every day | ORAL | 0 refills | Status: DC

## 2021-09-18 MED ORDER — EZETIMIBE 10 MG PO TABS: 10.0000 mg | ORAL_TABLET | Freq: Every day | ORAL | 0 refills | Status: DC

## 2021-09-18 MED ORDER — NITROGLYCERIN 0.4 MG SL SUBL: 0.4000 mg | SUBLINGUAL_TABLET | SUBLINGUAL | 0 refills | Status: DC | PRN

## 2021-09-18 NOTE — Telephone Encounter (Signed)
°*  STAT* If patient is at the pharmacy, call can be transferred to refill team.   1. Which medications need to be refilled? (please list name of each medication and dose if known) amLODipine (NORVASC) 5 MG tablet atorvastatin (LIPITOR) 20 MG tablet ezetimibe (ZETIA) 10 MG tablet hydrochlorothiazide (HYDRODIURIL) 25 MG tablet metoprolol tartrate (LOPRESSOR) 25 MG tablet nitroGLYCERIN (NITROSTAT) 0.4 MG SL tablet   2. Which pharmacy/location (including street and city if local pharmacy) is medication to be sent to?[id number] [place], [place] - [address]  3. Do they need a 30 day or 90 day supply? 90 day

## 2021-09-18 NOTE — Telephone Encounter (Signed)
Pt's medications were sent to pt's pharmacy as requested. Confirmation received.  

## 2021-09-26 ENCOUNTER — Other Ambulatory Visit: Payer: Self-pay | Admitting: Cardiovascular Disease

## 2021-11-27 ENCOUNTER — Telehealth: Payer: Self-pay | Admitting: *Deleted

## 2021-11-27 ENCOUNTER — Encounter: Payer: Self-pay | Admitting: *Deleted

## 2021-11-27 ENCOUNTER — Other Ambulatory Visit: Payer: Self-pay

## 2021-11-27 ENCOUNTER — Ambulatory Visit: Payer: BC Managed Care – PPO | Admitting: Physician Assistant

## 2021-11-27 DIAGNOSIS — I259 Chronic ischemic heart disease, unspecified: Secondary | ICD-10-CM

## 2021-11-27 DIAGNOSIS — I10 Essential (primary) hypertension: Secondary | ICD-10-CM

## 2021-11-27 DIAGNOSIS — I2510 Atherosclerotic heart disease of native coronary artery without angina pectoris: Secondary | ICD-10-CM

## 2021-11-27 DIAGNOSIS — E7800 Pure hypercholesterolemia, unspecified: Secondary | ICD-10-CM

## 2021-11-27 MED ORDER — NITROGLYCERIN 0.4 MG SL SUBL: 0.4000 mg | SUBLINGUAL_TABLET | SUBLINGUAL | 0 refills | Status: AC | PRN

## 2021-11-27 MED ORDER — METOPROLOL TARTRATE 25 MG PO TABS: 25.0000 mg | ORAL_TABLET | Freq: Two times a day (BID) | ORAL | 0 refills | Status: AC

## 2021-11-27 MED ORDER — HYDROCHLOROTHIAZIDE 25 MG PO TABS: 25.0000 mg | ORAL_TABLET | Freq: Every day | ORAL | 0 refills | Status: AC

## 2021-11-27 MED ORDER — EZETIMIBE 10 MG PO TABS: 10.0000 mg | ORAL_TABLET | Freq: Every day | ORAL | 0 refills | Status: AC

## 2021-11-27 MED ORDER — ATORVASTATIN CALCIUM 20 MG PO TABS: 20.0000 mg | ORAL_TABLET | Freq: Every day | ORAL | 0 refills | Status: AC

## 2021-11-27 MED ORDER — AMLODIPINE BESYLATE 5 MG PO TABS: 5.0000 mg | ORAL_TABLET | Freq: Every day | ORAL | 0 refills | Status: AC

## 2021-11-27 NOTE — Telephone Encounter (Signed)
Pt came to the office, reschedule her appointment to 01/08/22.   ?

## 2021-11-27 NOTE — Telephone Encounter (Signed)
Left detailed message for pt that appointment today, 11/27/21 had to be cancelled and for her to call back to reschedule.  ?

## 2021-11-28 DIAGNOSIS — Z0000 Encounter for general adult medical examination without abnormal findings: Secondary | ICD-10-CM | POA: Diagnosis not present

## 2021-11-28 DIAGNOSIS — I10 Essential (primary) hypertension: Secondary | ICD-10-CM | POA: Diagnosis not present

## 2021-11-28 DIAGNOSIS — Z803 Family history of malignant neoplasm of breast: Secondary | ICD-10-CM | POA: Diagnosis not present

## 2021-11-28 DIAGNOSIS — Z955 Presence of coronary angioplasty implant and graft: Secondary | ICD-10-CM | POA: Diagnosis not present

## 2021-11-28 DIAGNOSIS — E782 Mixed hyperlipidemia: Secondary | ICD-10-CM | POA: Diagnosis not present

## 2021-11-28 DIAGNOSIS — I2119 ST elevation (STEMI) myocardial infarction involving other coronary artery of inferior wall: Secondary | ICD-10-CM | POA: Diagnosis not present

## 2021-11-28 DIAGNOSIS — Z79899 Other long term (current) drug therapy: Secondary | ICD-10-CM | POA: Diagnosis not present

## 2021-11-28 DIAGNOSIS — Z8349 Family history of other endocrine, nutritional and metabolic diseases: Secondary | ICD-10-CM | POA: Diagnosis not present

## 2021-11-28 DIAGNOSIS — I119 Hypertensive heart disease without heart failure: Secondary | ICD-10-CM | POA: Diagnosis not present

## 2021-11-28 DIAGNOSIS — Z87891 Personal history of nicotine dependence: Secondary | ICD-10-CM | POA: Diagnosis not present

## 2021-12-07 DIAGNOSIS — E782 Mixed hyperlipidemia: Secondary | ICD-10-CM | POA: Diagnosis not present

## 2021-12-07 DIAGNOSIS — I2119 ST elevation (STEMI) myocardial infarction involving other coronary artery of inferior wall: Secondary | ICD-10-CM | POA: Diagnosis not present

## 2021-12-07 DIAGNOSIS — Z7982 Long term (current) use of aspirin: Secondary | ICD-10-CM | POA: Diagnosis not present

## 2021-12-07 DIAGNOSIS — I2510 Atherosclerotic heart disease of native coronary artery without angina pectoris: Secondary | ICD-10-CM | POA: Diagnosis not present

## 2021-12-07 DIAGNOSIS — I10 Essential (primary) hypertension: Secondary | ICD-10-CM | POA: Diagnosis not present

## 2022-01-08 ENCOUNTER — Ambulatory Visit: Payer: BC Managed Care – PPO | Admitting: Physician Assistant

## 2022-02-06 ENCOUNTER — Ambulatory Visit: Payer: BC Managed Care – PPO | Admitting: Physician Assistant

## 2022-02-06 NOTE — Progress Notes (Deleted)
Cardiology Office Note:    Date:  02/06/2022   ID:  Annette Singleton, DOB 01/27/1971, MRN [id number]  PCP:  Elias Else, MD  [facility] Providers Cardiologist:  Tonny Bollman, MD { Click to update primary MD,subspecialty MD or APP then REFRESH:1}  *** Referring MD: Elias Else, MD   Chief Complaint:  No chief complaint on file. {Click here for Visit Info    :1}   Patient Profile: *** CAD Inferior MI >> hx of BMS to distal RCA, July 2011 (EF = 55-60%)***, remains on Plavix. ***? HTN Obesity Hyperlipidemia  Anemia >> hx of heavy periods  Former smoker, Quit date: 02/20/2010 Subclinical hypothyroidism   Prior CV Studies: {Select studies to display:26339}  *** Cath ??? ***   History of Present Illness:   Annette Singleton is a 51 y.o. female with the above problem list.  She is a patient of Dr. Earmon Phoenix and has formerly seen Dr. Deborah Chalk.  She has seen Norma Fredrickson, NP and Tereso Newcomer, PA-C for telemedicine visits during the COVID-19 pandemic.   Last seen for cardiology services by Dr. Desma Maxim of [facility] on 12/07/2021 and this was her first time seeing a cardiologist in person since before the COVID-19 pandemic. Dr. Desma Maxim started her on Lipitor 80 mg on 12/07/2021 and d/c her red rice yeast.    Today she states the following ***          Past Medical History:  Diagnosis Date   CAD (coronary artery disease)    History of anemia    HTN (hypertension)    Hyperlipemia    Inferior MI [facility]) July 2011   with BMS to distal RCA; EF is normal.   Obese    Past use of tobacco    Current Medications: No outpatient medications have been marked as taking for the 02/06/22 encounter (Appointment) with Tereso Newcomer T, PA-C.    Allergies:   Zithromax [azithromycin]   Social History   Tobacco Use   Smoking status: Former    Types: Cigarettes    Quit date: 02/20/2010    Years since quitting: 11.9   Smokeless tobacco: Never  Substance Use Topics    Alcohol use: No   Drug use: No    Family Hx: The patient's family history includes Diabetes in her father and mother; Hypertension in her mother.  ROS ***  Past Surgical History:  Procedure Laterality Date   CORONARY STENT PLACEMENT  July 2011   distal RCA; EF is normal.      EKGs/Labs/Other Test Reviewed:    EKG:  EKG is *** ordered today.  The ekg ordered today demonstrates ***   Previous EKG on 12/07/2021 visit with Dr. Desma Maxim showed: Sinus rhythm, Possible Left atrial enlargement, Inferior infarct , age undetermined, ST and T wave abnormality, consider lateral ischemia. ***  Recent Labs: The following labs were drawn at [facility] on November 28, 2021:  TSH = 5.68 and T3 = 3.05 on 11/28/21 with positive Anti-thyroglobulin and positive Anti-TPO of 915. CMP was normal.  CBC and differential drawn  was normal.    Recent Lipid Panel No results for input(s): "CHOL", "TRIG", "HDL", "VLDL", "LDLCALC", "LDLDIRECT" in the last 8760 hours.  Most recent lipid panel from 11/28/2021 at [facility] was: LDL = 152, Goal < 70 Total Cholesterol = 220 Triglycerides = 260 Total chol/HDL = 6.1    Risk Assessment/Calculations/Metrics:   {Does this patient have ATRIAL FIBRILLATION?:[id number]}     No  BP recorded.  {Refresh Note OR Click here to enter BP  :1}***        Physical Exam:    VS:  There were no vitals taken for this visit.  ***  Wt Readings from Last 3 Encounters:  06/13/20 238 lb (108 kg)  04/23/19 238 lb (108 kg)  03/17/17 238 lb 6.4 oz (108.1 kg)    Physical Exam *** Physical Exam      ASSESSMENT & PLAN:   No problem-specific Assessment & Plan notes found for this encounter.  1. CAD Continue Aspirin, nitro PRN, metoprolol, antiplatelet ***??? *** 2. Hx of inferior MI ***  3. Hypertension *** Continue amlodipine, hctz, and metoprolol.   4. Hyperlipidemia ***   ,,Zetia 10, Lipitor 20  5. Anemia *** - Iron 325 mg tablet    6. Obesity  *** 7. Subclinical hypothyroidism***      {Are you ordering a CV Procedure (e.g. stress test, cath, DCCV, TEE, etc)?   Press F2        :[id number]}   Dispo:  No follow-ups on file.   Medication Adjustments/Labs and Tests Ordered: Current medicines are reviewed at length with the patient today.  Concerns regarding medicines are outlined above.  Tests Ordered: No orders of the defined types were placed in this encounter.  Medication Changes: No orders of the defined types were placed in this encounter.  SignedSharlene Dory, NP  02/06/2022 8:16 AM    [facility] [address] [place], [place], [place]  [address] Phone: [phone]; Fax: [phone]
